# Patient Record
Sex: Female | Born: 1960 | Race: Black or African American | Hispanic: No | Smoking: Former smoker
Health system: Southern US, Community
[De-identification: ages and names within clinical notes are randomized; demographics above are authoritative.]

## PROBLEM LIST (undated history)

## (undated) DIAGNOSIS — I509 Heart failure, unspecified: Secondary | ICD-10-CM

## (undated) DIAGNOSIS — F419 Anxiety disorder, unspecified: Secondary | ICD-10-CM

## (undated) DIAGNOSIS — E119 Type 2 diabetes mellitus without complications: Secondary | ICD-10-CM

## (undated) DIAGNOSIS — M1990 Unspecified osteoarthritis, unspecified site: Secondary | ICD-10-CM

## (undated) DIAGNOSIS — I10 Essential (primary) hypertension: Secondary | ICD-10-CM

---

## 2014-11-15 ENCOUNTER — Other Ambulatory Visit (HOSPITAL_BASED_OUTPATIENT_CLINIC_OR_DEPARTMENT_OTHER): Payer: Self-pay | Admitting: Family Medicine

## 2014-11-15 DIAGNOSIS — Z1231 Encounter for screening mammogram for malignant neoplasm of breast: Secondary | ICD-10-CM

## 2014-11-23 ENCOUNTER — Ambulatory Visit (HOSPITAL_BASED_OUTPATIENT_CLINIC_OR_DEPARTMENT_OTHER): Payer: Self-pay

## 2016-05-19 ENCOUNTER — Emergency Department (HOSPITAL_BASED_OUTPATIENT_CLINIC_OR_DEPARTMENT_OTHER): Payer: Medicare Other

## 2016-05-19 ENCOUNTER — Inpatient Hospital Stay (HOSPITAL_BASED_OUTPATIENT_CLINIC_OR_DEPARTMENT_OTHER)
Admission: EM | Admit: 2016-05-19 | Discharge: 2016-05-30 | DRG: 287 | Disposition: A | Discharge status: Home / Self Care | Payer: Medicare Other | Attending: Internal Medicine | Admitting: Internal Medicine

## 2016-05-19 ENCOUNTER — Encounter (HOSPITAL_BASED_OUTPATIENT_CLINIC_OR_DEPARTMENT_OTHER): Payer: Self-pay | Admitting: Emergency Medicine

## 2016-05-19 DIAGNOSIS — F17210 Nicotine dependence, cigarettes, uncomplicated: Secondary | ICD-10-CM | POA: Diagnosis present

## 2016-05-19 DIAGNOSIS — E042 Nontoxic multinodular goiter: Secondary | ICD-10-CM | POA: Diagnosis present

## 2016-05-19 DIAGNOSIS — I509 Heart failure, unspecified: Secondary | ICD-10-CM | POA: Diagnosis not present

## 2016-05-19 DIAGNOSIS — I110 Hypertensive heart disease with heart failure: Secondary | ICD-10-CM | POA: Diagnosis not present

## 2016-05-19 DIAGNOSIS — J040 Acute laryngitis: Secondary | ICD-10-CM | POA: Diagnosis not present

## 2016-05-19 DIAGNOSIS — M25511 Pain in right shoulder: Secondary | ICD-10-CM | POA: Diagnosis present

## 2016-05-19 DIAGNOSIS — K5900 Constipation, unspecified: Secondary | ICD-10-CM | POA: Diagnosis present

## 2016-05-19 DIAGNOSIS — K449 Diaphragmatic hernia without obstruction or gangrene: Secondary | ICD-10-CM | POA: Diagnosis present

## 2016-05-19 DIAGNOSIS — K047 Periapical abscess without sinus: Secondary | ICD-10-CM | POA: Diagnosis present

## 2016-05-19 DIAGNOSIS — Z23 Encounter for immunization: Secondary | ICD-10-CM

## 2016-05-19 DIAGNOSIS — R1013 Epigastric pain: Secondary | ICD-10-CM

## 2016-05-19 DIAGNOSIS — Z6841 Body Mass Index (BMI) 40.0 and over, adult: Secondary | ICD-10-CM

## 2016-05-19 DIAGNOSIS — Z0189 Encounter for other specified special examinations: Secondary | ICD-10-CM

## 2016-05-19 DIAGNOSIS — F329 Major depressive disorder, single episode, unspecified: Secondary | ICD-10-CM | POA: Diagnosis present

## 2016-05-19 DIAGNOSIS — I420 Dilated cardiomyopathy: Secondary | ICD-10-CM

## 2016-05-19 DIAGNOSIS — I5021 Acute systolic (congestive) heart failure: Secondary | ICD-10-CM

## 2016-05-19 DIAGNOSIS — I10 Essential (primary) hypertension: Secondary | ICD-10-CM

## 2016-05-19 DIAGNOSIS — R7989 Other specified abnormal findings of blood chemistry: Secondary | ICD-10-CM

## 2016-05-19 DIAGNOSIS — R079 Chest pain, unspecified: Secondary | ICD-10-CM

## 2016-05-19 DIAGNOSIS — R748 Abnormal levels of other serum enzymes: Secondary | ICD-10-CM | POA: Diagnosis present

## 2016-05-19 DIAGNOSIS — Z716 Tobacco abuse counseling: Secondary | ICD-10-CM

## 2016-05-19 DIAGNOSIS — Z888 Allergy status to other drugs, medicaments and biological substances status: Secondary | ICD-10-CM

## 2016-05-19 DIAGNOSIS — K219 Gastro-esophageal reflux disease without esophagitis: Secondary | ICD-10-CM | POA: Diagnosis present

## 2016-05-19 DIAGNOSIS — F419 Anxiety disorder, unspecified: Secondary | ICD-10-CM | POA: Diagnosis present

## 2016-05-19 DIAGNOSIS — I5043 Acute on chronic combined systolic (congestive) and diastolic (congestive) heart failure: Secondary | ICD-10-CM | POA: Diagnosis present

## 2016-05-19 DIAGNOSIS — Z79899 Other long term (current) drug therapy: Secondary | ICD-10-CM

## 2016-05-19 DIAGNOSIS — R0602 Shortness of breath: Secondary | ICD-10-CM

## 2016-05-19 DIAGNOSIS — I472 Ventricular tachycardia: Secondary | ICD-10-CM | POA: Diagnosis not present

## 2016-05-19 DIAGNOSIS — R072 Precordial pain: Secondary | ICD-10-CM | POA: Diagnosis not present

## 2016-05-19 DIAGNOSIS — R109 Unspecified abdominal pain: Secondary | ICD-10-CM

## 2016-05-19 DIAGNOSIS — N179 Acute kidney failure, unspecified: Secondary | ICD-10-CM

## 2016-05-19 DIAGNOSIS — E119 Type 2 diabetes mellitus without complications: Secondary | ICD-10-CM

## 2016-05-19 DIAGNOSIS — Z8249 Family history of ischemic heart disease and other diseases of the circulatory system: Secondary | ICD-10-CM

## 2016-05-19 DIAGNOSIS — R778 Other specified abnormalities of plasma proteins: Secondary | ICD-10-CM

## 2016-05-19 HISTORY — DX: Essential (primary) hypertension: I10

## 2016-05-19 HISTORY — DX: Unspecified osteoarthritis, unspecified site: M19.90

## 2016-05-19 HISTORY — DX: Anxiety disorder, unspecified: F41.9

## 2016-05-19 HISTORY — DX: Type 2 diabetes mellitus without complications: E11.9

## 2016-05-19 LAB — BASIC METABOLIC PANEL
ANION GAP: 10 (ref 5–15)
BUN: 12 mg/dL (ref 6–20)
CO2: 26 mmol/L (ref 22–32)
Calcium: 9.5 mg/dL (ref 8.9–10.3)
Chloride: 102 mmol/L (ref 101–111)
Creatinine, Ser: 1.09 mg/dL — ABNORMAL HIGH (ref 0.44–1.00)
GFR calc Af Amer: >60 mL/min (ref >60)
GFR, EST NON AFRICAN AMERICAN: 56 mL/min — AB (ref >60)
GLUCOSE: 136 mg/dL — AB (ref 65–99)
POTASSIUM: 4 mmol/L (ref 3.5–5.1)
SODIUM: 138 mmol/L (ref 135–145)

## 2016-05-19 LAB — CBC WITH DIFFERENTIAL/PLATELET
BASOS ABS: 0 10*3/uL (ref 0.0–0.1)
BASOS PCT: 0 %
Eosinophils Absolute: 0.3 10*3/uL (ref 0.0–0.7)
Eosinophils Relative: 2 %
HEMATOCRIT: 41.3 % (ref 36.0–46.0)
Hemoglobin: 13.5 g/dL (ref 12.0–15.0)
Lymphocytes Relative: 17 %
Lymphs Abs: 2.1 10*3/uL (ref 0.7–4.0)
MCH: 26.7 pg (ref 26.0–34.0)
MCHC: 32.7 g/dL (ref 30.0–36.0)
MCV: 81.8 fL (ref 78.0–100.0)
Monocytes Absolute: 0.8 10*3/uL (ref 0.1–1.0)
Monocytes Relative: 7 %
NEUTROS ABS: 9 10*3/uL — AB (ref 1.7–7.7)
NEUTROS PCT: 74 %
Platelets: 299 10*3/uL (ref 150–400)
RBC: 5.05 MIL/uL (ref 3.87–5.11)
RDW: 16.5 % — ABNORMAL HIGH (ref 11.5–15.5)
WBC: 12.2 10*3/uL — ABNORMAL HIGH (ref 4.0–10.5)

## 2016-05-19 LAB — TROPONIN I: TROPONIN I: 0.03 ng/mL — AB (ref <0.03)

## 2016-05-19 LAB — BRAIN NATRIURETIC PEPTIDE: B Natriuretic Peptide: 249.4 pg/mL — ABNORMAL HIGH (ref 0.0–100.0)

## 2016-05-19 MED ORDER — ASPIRIN 81 MG PO CHEW
324.0000 mg | CHEWABLE_TABLET | Freq: Once | ORAL | Status: AC
  Administered 2016-05-19: 324 mg via ORAL
  Filled 2016-05-19: qty 4

## 2016-05-19 MED ORDER — ONDANSETRON HCL 4 MG/2ML IJ SOLN
4.0000 mg | Freq: Four times a day (QID) | INTRAMUSCULAR | Status: DC | PRN
  Filled 2016-05-19: qty 2

## 2016-05-19 MED ORDER — ACETAMINOPHEN 325 MG PO TABS: 650.0000 mg | ORAL_TABLET | ORAL | Status: DC | PRN

## 2016-05-19 MED ORDER — ENOXAPARIN SODIUM 40 MG/0.4ML ~~LOC~~ SOLN
40.0000 mg | SUBCUTANEOUS | Status: DC
  Administered 2016-05-20: 40 mg via SUBCUTANEOUS
  Filled 2016-05-19: qty 0.4

## 2016-05-19 MED ORDER — FUROSEMIDE 20 MG PO TABS: 20.0000 mg | ORAL_TABLET | Freq: Every day | ORAL | Status: DC

## 2016-05-19 MED ORDER — SODIUM CHLORIDE 0.9 % IV SOLN: 250.0000 mL | INTRAVENOUS | Status: DC | PRN

## 2016-05-19 MED ORDER — METOPROLOL TARTRATE 25 MG PO TABS
25.0000 mg | ORAL_TABLET | Freq: Two times a day (BID) | ORAL | Status: DC
  Administered 2016-05-20 – 2016-05-22 (×6): 25 mg via ORAL
  Filled 2016-05-19 (×6): qty 1

## 2016-05-19 MED ORDER — FUROSEMIDE 10 MG/ML IJ SOLN
40.0000 mg | INTRAMUSCULAR | Status: AC
  Administered 2016-05-19: 40 mg via INTRAVENOUS
  Filled 2016-05-19: qty 4

## 2016-05-19 MED ORDER — SODIUM CHLORIDE 0.9% FLUSH
3.0000 mL | Freq: Two times a day (BID) | INTRAVENOUS | Status: DC
  Administered 2016-05-20 – 2016-05-29 (×16): 3 mL via INTRAVENOUS

## 2016-05-19 MED ORDER — LISINOPRIL 5 MG PO TABS: 5.0000 mg | ORAL_TABLET | Freq: Every day | ORAL | Status: DC

## 2016-05-19 MED ORDER — VARENICLINE TARTRATE 0.5 MG PO TABS
0.5000 mg | ORAL_TABLET | Freq: Two times a day (BID) | ORAL | Status: DC
  Filled 2016-05-19 (×2): qty 1

## 2016-05-19 MED ORDER — OXYCODONE HCL 5 MG PO TABS
30.0000 mg | ORAL_TABLET | Freq: Four times a day (QID) | ORAL | Status: DC | PRN
  Administered 2016-05-20 – 2016-05-30 (×34): 30 mg via ORAL
  Filled 2016-05-19 (×35): qty 6

## 2016-05-19 MED ORDER — NIFEDIPINE ER 60 MG PO TB24
60.0000 mg | ORAL_TABLET | Freq: Every day | ORAL | Status: DC
  Administered 2016-05-20: 60 mg via ORAL
  Filled 2016-05-19: qty 1

## 2016-05-19 MED ORDER — SODIUM CHLORIDE 0.9% FLUSH: 3.0000 mL | INTRAVENOUS | Status: DC | PRN

## 2016-05-19 NOTE — H&P (Addendum)
History and Physical    Anjannette Gauger ZOX:[id number] DOB: 21-Dec-1960 DOA: 05/19/2016  PCP: Elpidio Eric, MD  Patient coming from: Home  I have personally briefly reviewed patient's old medical records in [facility]  Chief Complaint: SOB  HPI: Kristopher Delk is a 56 y.o. female with medical history significant of HTN, DM2.  Patient presents to the ED with c/o 1 week history of LE swelling R>L, orthopnea, chest pressure.  SOB worse when laying down, better when sitting up.  DOE.   ED Course: Trop 0.03, BNP 250, CXR shows mild pulmonary vascular congestion.  Edema has already improved post  IV lasix.   Review of Systems: As per HPI otherwise 10 point review of systems negative.   Past Medical History:  Diagnosis Date  . Anxiety   . Arthritis   . Diabetes mellitus without complication (HCC)   . Hypertension     History reviewed. No pertinent surgical history.   reports that she has been smoking.  She has never used smokeless tobacco. She reports that she drinks alcohol. She reports that she does not use drugs.  Allergies  Allergen Reactions  . Lisinopril Other (See Comments)    Cough    History reviewed. No pertinent family history. Neg for CHF  Prior to Admission medications   Medication Sig Start Date End Date Taking? Authorizing Provider  metoprolol tartrate (LOPRESSOR) 25 MG tablet Take 25 mg by mouth 2 (two) times daily.   Yes Historical Provider, MD  NIFEdipine (PROCARDIA XL/ADALAT-CC) 60 MG 24 hr tablet Take 60 mg by mouth daily.   Yes Historical Provider, MD  oxycodone (ROXICODONE) 30 MG immediate release tablet Take 30 mg by mouth every 6 (six) hours as needed for pain.   Yes Historical Provider, MD  varenicline (CHANTIX) 0.5 MG tablet Take 0.5 mg by mouth 2 (two) times daily.   Yes Historical Provider, MD    Physical Exam: Vitals:   05/19/16 2100 05/19/16 2130 05/19/16 2150 05/19/16 2306  BP: 123/79  (!) 155/69 (!) 152/109  Pulse: 78 86 80 68  Resp:  (!) 23 (!) 21 (!) 22 (!) 26  Temp:   98.7 F (37.1 C) 98.7 F (37.1 C)  TempSrc:   Oral Oral  SpO2: 94% 99% 98% 100%  Weight:    (!) 153.6 kg (338 lb 9.6 oz)  Height:    5' 7.5" (1.715 m)    Constitutional: NAD, calm, comfortable Eyes: PERRL, lids and conjunctivae normal ENMT: Mucous membranes are moist. Posterior pharynx clear of any exudate or lesions.Normal dentition. L tympanic membrane clear, EOC clear, appears to have TMJ on left causing ear pain. Neck: normal, supple, no masses, no thyromegaly Respiratory: Wheezes R base, crackles L base Cardiovascular: Regular rate and rhythm, no murmurs / rubs / gallops. Trace BLE edema. 2+ pedal pulses. No carotid bruits.  Abdomen: no tenderness, no masses palpated. No hepatosplenomegaly. Bowel sounds positive.  Musculoskeletal: no clubbing / cyanosis. No joint deformity upper and lower extremities. Good ROM, no contractures. Normal muscle tone.  Skin: no rashes, lesions, ulcers. No induration Neurologic: CN 2-12 grossly intact. Sensation intact, DTR normal. Strength 5/5 in all 4.  Psychiatric: Normal judgment and insight. Alert and oriented x 3. Normal mood.    Labs on Admission: I have personally reviewed following labs and imaging studies  CBC:  Recent Labs Lab 05/19/16 1855  WBC 12.2*  NEUTROABS 9.0*  HGB 13.5  HCT 41.3  MCV 81.8  PLT 299   Basic Metabolic Panel:  Recent Labs Lab 05/19/16 1855  NA 138  K 4.0  CL 102  CO2 26  GLUCOSE 136*  BUN 12  CREATININE 1.09*  CALCIUM 9.5   GFR: Estimated Creatinine Clearance: 90.2 mL/min (A) (by C-G formula based on SCr of 1.09 mg/dL (H)). Liver Function Tests: No results for input(s): AST, ALT, ALKPHOS, BILITOT, PROT, ALBUMIN in the last 168 hours. No results for input(s): LIPASE, AMYLASE in the last 168 hours. No results for input(s): AMMONIA in the last 168 hours. Coagulation Profile: No results for input(s): INR, PROTIME in the last 168 hours. Cardiac  Enzymes:  Recent Labs Lab 05/19/16 1855  TROPONINI 0.03*   BNP (last 3 results) No results for input(s): PROBNP in the last 8760 hours. HbA1C: No results for input(s): HGBA1C in the last 72 hours. CBG: No results for input(s): GLUCAP in the last 168 hours. Lipid Profile: No results for input(s): CHOL, HDL, LDLCALC, TRIG, CHOLHDL, LDLDIRECT in the last 72 hours. Thyroid Function Tests: No results for input(s): TSH, T4TOTAL, FREET4, T3FREE, THYROIDAB in the last 72 hours. Anemia Panel: No results for input(s): VITAMINB12, FOLATE, FERRITIN, TIBC, IRON, RETICCTPCT in the last 72 hours. Urine analysis: No results found for: COLORURINE, APPEARANCEUR, LABSPEC, PHURINE, GLUCOSEU, HGBUR, BILIRUBINUR, KETONESUR, PROTEINUR, UROBILINOGEN, NITRITE, LEUKOCYTESUR  Radiological Exams on Admission: Dg Chest 2 View  Result Date: 05/19/2016 CLINICAL DATA:  Chest pain. EXAM: CHEST  2 VIEW COMPARISON:  Radiographs of August 06, 2012. FINDINGS: Stable cardiomegaly. No pneumothorax or pleural effusion is noted. Mild central pulmonary vascular congestion is noted. No consolidative process is noted. Bony thorax is unremarkable. IMPRESSION: Stable cardiomegaly. Mild central pulmonary vascular congestion is noted. Electronically Signed   By: Lupita Raider, M.D.   On: 05/19/2016 19:30    EKG: Independently reviewed.  Assessment/Plan Principal Problem:   CHF exacerbation (HCC)    1. New onset CHF - DDX includes edema secondary to CCB although this wouldn't explain the SOB, orthopnea symptoms.  PE and RLE DVT seems less likely given CXR findings. 1. CHF pathway 2. Lasix  PO daily 3. Allergy to lisinopril 4. 2d echo ordered to evaluate further 5. Tele monitor 2. HTN - continue home BP meds 3. DM2 - patient takes 70/30: 50u in AM, and 40u in PM but BGLs have been running low recently she says. 1. Will reduce dose to 35u BID 2. CBG checks AC/HS  DVT prophylaxis: Lovenox Code Status: Full Family  Communication: Family at bed side Disposition Plan: Home after admit Consults called: None Admission status: Place in [place], Heywood Iles. DO [facility] Pager [phone]  If 7AM-7PM, please contact day team taking care of patient [email or web address] Password [email or web address]  05/19/2016, 11:17 PM

## 2016-05-19 NOTE — ED Provider Notes (Signed)
[facility] Provider Note   CSN: [id number] Arrival date & time: 05/19/16  1821  By signing my name below, I, Freida Busman, attest that this documentation has been prepared under the direction and in the presence of Roxy Horseman, PA-C. Electronically Signed: Freida Busman, Scribe. 05/19/2016. 6:51 PM.  History   Chief Complaint Chief Complaint  Patient presents with  . Chest Pain    The history is provided by the patient. No language interpreter was used.    HPI Comments:  Amber Melendez is a 56 y.o. female with a history of DM and HTN,  who presents to the Emergency Department complaining of central CP x a few days. She also describes a heaviness in her epigastric region. Her pain is worse at night when supine. She reports associated SOB and DOE. She has some relief when she sits upright. She denies hx of cardiac and kidney issues.  Pt also notes right foot swelling that is improving. No nausea, vomiting, or diaphoresis.   Past Medical History:  Diagnosis Date  . Anxiety   . Arthritis   . Diabetes mellitus without complication (HCC)   . Hypertension     Patient Active Problem List   Diagnosis Date Noted  . CHF exacerbation (HCC) 05/19/2016    No past surgical history on file.  OB History    No data available       Home Medications    Prior to Admission medications   Medication Sig Start Date End Date Taking? Authorizing Provider  metoprolol tartrate (LOPRESSOR) 25 MG tablet Take 25 mg by mouth 2 (two) times daily.   Yes Historical Provider, MD  NIFEdipine (PROCARDIA XL/ADALAT-CC) 60 MG 24 hr tablet Take 60 mg by mouth daily.   Yes Historical Provider, MD  oxycodone (ROXICODONE) 30 MG immediate release tablet Take 30 mg by mouth every 6 (six) hours as needed for pain.   Yes Historical Provider, MD  varenicline (CHANTIX) 0.5 MG tablet Take 0.5 mg by mouth 2 (two) times daily.   Yes Historical Provider, MD    Family History No family history on  file.  Social History Social History  Substance Use Topics  . Smoking status: Current Every Day Smoker  . Smokeless tobacco: Never Used  . Alcohol use Yes     Allergies   Patient has no known allergies.   Review of Systems Review of Systems  Constitutional: Negative for diaphoresis.  Respiratory: Positive for shortness of breath.   Cardiovascular: Positive for chest pain and leg swelling.  Gastrointestinal: Positive for abdominal pain. Negative for nausea and vomiting.  All other systems reviewed and are negative.    Physical Exam Updated Vital Signs BP (!) 155/69   Pulse 80   Temp 98.7 F (37.1 C) (Oral)   Resp (!) 22   Ht  (1.727 m)   Wt (!) 325 lb (147.4 kg)   LMP 04/22/2016   SpO2 98%   BMI 49.42 kg/m   Physical Exam  Constitutional: She is oriented to person, place, and time. She appears well-developed and well-nourished. No distress.  HENT:  Head: Normocephalic and atraumatic.  Eyes: Conjunctivae are normal.  Cardiovascular: Normal rate.   Pulmonary/Chest: Effort normal. She has wheezes in the left lower field. She has rhonchi in the right lower field.  Right lower lobe rhonchi  Left lower lobe mild expiratory wheeze   Abdominal: She exhibits no distension.  Musculoskeletal: She exhibits edema (mild 1+ BLE ).  Neurological: She is alert and  oriented to person, place, and time.  Skin: Skin is warm and dry.  Psychiatric: She has a normal mood and affect.  Nursing note and vitals reviewed.    ED Treatments / Results  DIAGNOSTIC STUDIES:  Oxygen Saturation is 96% on RA, normal by my interpretation.    COORDINATION OF CARE:  10:27 PM Discussed treatment plan with pt at bedside and pt agreed to plan.  Labs (all labs ordered are listed, but only abnormal results are displayed) Labs Reviewed  BASIC METABOLIC PANEL - Abnormal; Notable for the following:       Result Value   Glucose, Bld 136 (*)    Creatinine, Ser 1.09 (*)    GFR calc non Af  Amer 56 (*)    All other components within normal limits  TROPONIN I - Abnormal; Notable for the following:    Troponin I 0.03 (*)    All other components within normal limits  CBC WITH DIFFERENTIAL/PLATELET - Abnormal; Notable for the following:    WBC 12.2 (*)    RDW 16.5 (*)    Neutro Abs 9.0 (*)    All other components within normal limits  BRAIN NATRIURETIC PEPTIDE - Abnormal; Notable for the following:    B Natriuretic Peptide 249.4 (*)    All other components within normal limits    EKG  EKG Interpretation  Date/Time:  Monday May 19 2016 18:36:08 EDT Ventricular Rate:  78 PR Interval:    QRS Duration: 94 QT Interval:  429 QTC Calculation: 489 R Axis:   49 Text Interpretation:  Sinus rhythm Atrial premature complexes Low voltage, precordial leads Borderline prolonged QT interval No old tracing to compare Confirmed by FLOYD MD, DANIEL [phone]) on 05/19/2016 6:45:46 PM       Radiology Dg Chest 2 View  Result Date: 05/19/2016 CLINICAL DATA:  Chest pain. EXAM: CHEST  2 VIEW COMPARISON:  Radiographs of August 06, 2012. FINDINGS: Stable cardiomegaly. No pneumothorax or pleural effusion is noted. Mild central pulmonary vascular congestion is noted. No consolidative process is noted. Bony thorax is unremarkable. IMPRESSION: Stable cardiomegaly. Mild central pulmonary vascular congestion is noted. Electronically Signed   By: Lupita Raider, M.D.   On: 05/19/2016 19:30    Procedures Procedures (including critical care time)  Medications Ordered in ED Medications  aspirin chewable tablet 324 mg (324 mg Oral Given 05/19/16 1845)  furosemide (LASIX) injection 40 mg (40 mg Intravenous Given 05/19/16 2015)     Initial Impression / Assessment and Plan / ED Course  I have reviewed the triage vital signs and the nursing notes.  Pertinent labs & imaging results that were available during my care of the patient were reviewed by me and considered in my medical decision making (see  chart for details).     Patient with dyspnea on exertion, lower extremity swelling, and chest pressure. Reports associated orthopnea. No history of CHF. Symptoms are concerning for CHF, will check BNP, chest x-ray, and labs. Chest x-ray shows pulmonary vascular congestion. BNP is 249. Troponin is 0.03, likely demand. Will give patient 40 mg IV Lasix. Discussed with Dr. Adela Lank, who agrees with plan for admission.  Appreciate Dr. Kara Pacer for admitting the patient.  Final Clinical Impressions(s) / ED Diagnoses   Final diagnoses:  SOB (shortness of breath)  Elevated brain natriuretic peptide (BNP) level  Elevated troponin    New Prescriptions Current Discharge Medication List     I personally performed the services described in this documentation, which was scribed in  my presence. The recorded information has been reviewed and is accurate.       Roxy Horseman, PA-C 05/19/16 2243    Melene Plan, DO 05/19/16 2310

## 2016-05-19 NOTE — ED Notes (Signed)
ED Provider at bedside. 

## 2016-05-19 NOTE — ED Triage Notes (Signed)
Pt having centralized chest pain since Friday.  Pt having right shoulder pain for over one month.  Some sob with lying down.  No N/V or diaphoresis.  Pt mother died at 25 from MI.

## 2016-05-19 NOTE — ED Notes (Signed)
Pt assisted to bathroom. NAD and steady gait.

## 2016-05-19 NOTE — Plan of Care (Signed)
PCP Richard Harvie Junior  56 yo F Hx of HTN DM no prior hx of CHF Comes with chest pressure, leg edema BNP 250 no prior Trop 0.03  No ECG changes ER lasix 40  BP 139/83, on 94% RA  No current chest pain  Accepted to tele bed Amber Melendez 8:33 PM

## 2016-05-19 NOTE — ED Notes (Signed)
Patient transported to X-ray 

## 2016-05-20 ENCOUNTER — Observation Stay (HOSPITAL_BASED_OUTPATIENT_CLINIC_OR_DEPARTMENT_OTHER): Payer: Medicare Other

## 2016-05-20 ENCOUNTER — Encounter (HOSPITAL_COMMUNITY): Payer: Self-pay

## 2016-05-20 DIAGNOSIS — Z794 Long term (current) use of insulin: Secondary | ICD-10-CM | POA: Diagnosis not present

## 2016-05-20 DIAGNOSIS — E119 Type 2 diabetes mellitus without complications: Secondary | ICD-10-CM

## 2016-05-20 DIAGNOSIS — I10 Essential (primary) hypertension: Secondary | ICD-10-CM | POA: Diagnosis not present

## 2016-05-20 DIAGNOSIS — E1165 Type 2 diabetes mellitus with hyperglycemia: Secondary | ICD-10-CM | POA: Diagnosis not present

## 2016-05-20 DIAGNOSIS — R7989 Other specified abnormal findings of blood chemistry: Secondary | ICD-10-CM | POA: Diagnosis not present

## 2016-05-20 DIAGNOSIS — I509 Heart failure, unspecified: Secondary | ICD-10-CM | POA: Diagnosis not present

## 2016-05-20 LAB — HIV ANTIBODY (ROUTINE TESTING W REFLEX): HIV SCREEN 4TH GENERATION: NONREACTIVE

## 2016-05-20 LAB — BASIC METABOLIC PANEL
ANION GAP: 10 (ref 5–15)
BUN: 15 mg/dL (ref 6–20)
CHLORIDE: 102 mmol/L (ref 101–111)
CO2: 27 mmol/L (ref 22–32)
Calcium: 9.4 mg/dL (ref 8.9–10.3)
Creatinine, Ser: 1.09 mg/dL — ABNORMAL HIGH (ref 0.44–1.00)
GFR calc Af Amer: >60 mL/min (ref >60)
GFR calc non Af Amer: 56 mL/min — ABNORMAL LOW (ref >60)
GLUCOSE: 140 mg/dL — AB (ref 65–99)
POTASSIUM: 3.8 mmol/L (ref 3.5–5.1)
Sodium: 139 mmol/L (ref 135–145)

## 2016-05-20 LAB — ECHOCARDIOGRAM COMPLETE
HEIGHTINCHES: 67.5 in
Weight: 5416 oz

## 2016-05-20 LAB — GLUCOSE, CAPILLARY
GLUCOSE-CAPILLARY: 109 mg/dL — AB (ref 65–99)
Glucose-Capillary: 131 mg/dL — ABNORMAL HIGH (ref 65–99)
Glucose-Capillary: 107 mg/dL — ABNORMAL HIGH (ref 65–99)
Glucose-Capillary: 113 mg/dL — ABNORMAL HIGH (ref 65–99)

## 2016-05-20 LAB — TROPONIN I: Troponin I: <0.03 ng/mL (ref <0.03)

## 2016-05-20 MED ORDER — LOSARTAN POTASSIUM 25 MG PO TABS
25.0000 mg | ORAL_TABLET | Freq: Every day | ORAL | Status: DC
  Administered 2016-05-21 – 2016-05-22 (×2): 25 mg via ORAL
  Filled 2016-05-20 (×2): qty 1

## 2016-05-20 MED ORDER — GABAPENTIN 300 MG PO CAPS
600.0000 mg | ORAL_CAPSULE | Freq: Every day | ORAL | Status: DC
  Administered 2016-05-20 – 2016-05-30 (×11): 600 mg via ORAL
  Filled 2016-05-20 (×11): qty 2

## 2016-05-20 MED ORDER — DIPHENHYDRAMINE HCL 25 MG PO CAPS
25.0000 mg | ORAL_CAPSULE | ORAL | Status: DC | PRN
  Administered 2016-05-20 – 2016-05-24 (×2): 25 mg via ORAL
  Filled 2016-05-20 (×2): qty 1

## 2016-05-20 MED ORDER — FUROSEMIDE 10 MG/ML IJ SOLN
40.0000 mg | Freq: Two times a day (BID) | INTRAMUSCULAR | Status: DC
  Administered 2016-05-20 – 2016-05-21 (×3): 40 mg via INTRAVENOUS
  Filled 2016-05-20 (×3): qty 4

## 2016-05-20 MED ORDER — GABAPENTIN 300 MG PO CAPS
900.0000 mg | ORAL_CAPSULE | Freq: Every day | ORAL | Status: DC
  Administered 2016-05-20 – 2016-05-29 (×10): 900 mg via ORAL
  Filled 2016-05-20 (×10): qty 3

## 2016-05-20 MED ORDER — ENOXAPARIN SODIUM 80 MG/0.8ML ~~LOC~~ SOLN
0.5000 mg/kg | SUBCUTANEOUS | Status: DC
  Administered 2016-05-20 – 2016-05-24 (×5): 75 mg via SUBCUTANEOUS
  Filled 2016-05-20 (×5): qty 0.8

## 2016-05-20 MED ORDER — FLUOXETINE HCL 20 MG PO CAPS
40.0000 mg | ORAL_CAPSULE | Freq: Every day | ORAL | Status: DC
  Administered 2016-05-20 – 2016-05-30 (×12): 40 mg via ORAL
  Filled 2016-05-20 (×12): qty 2

## 2016-05-20 MED ORDER — PNEUMOCOCCAL VAC POLYVALENT 25 MCG/0.5ML IJ INJ
0.5000 mL | INJECTION | INTRAMUSCULAR | Status: AC
  Administered 2016-05-21: 0.5 mL via INTRAMUSCULAR
  Filled 2016-05-20: qty 0.5

## 2016-05-20 MED ORDER — PANTOPRAZOLE SODIUM 40 MG PO TBEC
40.0000 mg | DELAYED_RELEASE_TABLET | Freq: Every day | ORAL | Status: DC
  Administered 2016-05-20 – 2016-05-21 (×2): 40 mg via ORAL
  Filled 2016-05-20 (×3): qty 1

## 2016-05-20 MED ORDER — INSULIN ASPART PROT & ASPART (70-30 MIX) 100 UNIT/ML ~~LOC~~ SUSP
35.0000 [IU] | Freq: Two times a day (BID) | SUBCUTANEOUS | Status: DC
  Administered 2016-05-20 – 2016-05-30 (×5): 35 [IU] via SUBCUTANEOUS
  Filled 2016-05-20 (×3): qty 10

## 2016-05-20 NOTE — Care Management Obs Status (Signed)
MEDICARE OBSERVATION STATUS NOTIFICATION   Patient Details  Name: Amber Melendez MRN: [id number] Date of Birth: 13-Oct-1960   Medicare Observation Status Notification Given:  Yes    MahabirOlegario Messier, RN 05/20/2016, 1:07 PM

## 2016-05-20 NOTE — Progress Notes (Addendum)
[facility] PROGRESS NOTE    Progress Note  Amber Melendez  ZOX:[id number] DOB: 1960/09/08 DOA: 05/19/2016 PCP: Elpidio Eric, MD     Brief Narrative:   Amber Melendez is an 56 y.o. female past medical history of essential hypertension, diabetes mellitus comes into the ED complaining of one-week lower extremity edema right greater than left orthopnea, his BMP was greater than 250 chest x-ray showed possible pulmonary edema.  Assessment/Plan:   Undeterminate CHF exacerbation (HCC) At home she is on Lasix 20 mg, he has an allergy to Lisinopril. 2-D echo is pending. Continue daily weights. Strict I's and O's her on nitrate, continue fluid restriction. Continue IV Lasix every 12 hours monitor electrolytes replete as needed. Currently on beta blocker, will add Losartan Unknown estimated dry weight.  Essential HTN (hypertension) Blood pressure mildly high being more towards DC nifedipine. He has an allergy to lisinopril, so we'll try to use losartan.  DM2 (diabetes mellitus, type 2) (HCC): Continue current regimen. Fair control.  Right shoulder pain: She cannot elevate her arms above her head likely a rotator cuff injury will continue NSAIDs.  Elevated troponins: In the setting of chronic renal disease and acute decompensated heart failure. -Repeated another set of cardiac enzymes. If flat or improve is probably due to acute decompensated heart failure in the setting of chronic renal disease.   DVT prophylaxis: lovenox Family Communication:none Disposition Plan/Barrier to D/C: home in 3-4 days Code Status:     Code Status Orders        Start     Ordered   05/19/16 2253  Full code  Continuous     05/19/16 2253    Code Status History    Date Active Date Inactive Code Status Order ID Comments User Context   This patient has a current code status but no historical code status.        IV Access:    Peripheral IV   Procedures and diagnostic studies:   Dg Chest 2  View  Result Date: 05/19/2016 CLINICAL DATA:  Chest pain. EXAM: CHEST  2 VIEW COMPARISON:  Radiographs of August 06, 2012. FINDINGS: Stable cardiomegaly. No pneumothorax or pleural effusion is noted. Mild central pulmonary vascular congestion is noted. No consolidative process is noted. Bony thorax is unremarkable. IMPRESSION: Stable cardiomegaly. Mild central pulmonary vascular congestion is noted. Electronically Signed   By: Lupita Raider, M.D.   On: 05/19/2016 19:30     Medical Consultants:    None.  Anti-Infectives:   None  Subjective:    Amber Melendez she relates her breathing is better, she will also relates she can now a flat to sleep.  Objective:    Vitals:   05/19/16 2130 05/19/16 2150 05/19/16 2306 05/20/16 0522  BP:  (!) 155/69 (!) 152/109 (!) 156/64  Pulse: 86 80 68 70  Resp: (!) 21 (!) 22 (!) 26 (!) 22  Temp:  98.7 F (37.1 C) 98.7 F (37.1 C) 98.6 F (37 C)  TempSrc:  Oral Oral Oral  SpO2: 99% 98% 100% 100%  Weight:   (!) 153.6 kg (338 lb 9.6 oz) (!) 153.5 kg (338 lb 8 oz)  Height:   5' 7.5" (1.715 m)     Intake/Output Summary (Last 24 hours) at 05/20/16 0837 Last data filed at 05/19/16 2358  Gross per 24 hour  Intake              100 ml  Output  0 ml  Net              100 ml   Filed Weights   05/19/16 1832 05/19/16 2306 05/20/16 0522  Weight: (!) 147.4 kg (325 lb) (!) 153.6 kg (338 lb 9.6 oz) (!) 153.5 kg (338 lb 8 oz)    Exam: General exam:In no acute distress she is tearful Respiratory system: Good air movement clear to auscultation Cardiovascular system: Regular rate and rhythm with positive S1-S2 Gastrointestinal system: Positive bowel sounds, soft nontender Central nervous system: Nonfocal Extremities: Trace lower extremity edema Skin: No rashes or ulceration.   Data Reviewed:    Labs: Basic Metabolic Panel:  Recent Labs Lab 05/19/16 1855 05/20/16 0541  NA 138 139  K 4.0 3.8  CL 102 102  CO2 26 27  GLUCOSE 136*  140*  BUN 12 15  CREATININE 1.09* 1.09*  CALCIUM 9.5 9.4   GFR Estimated Creatinine Clearance: 90.2 mL/min (A) (by C-G formula based on SCr of 1.09 mg/dL (H)). Liver Function Tests: No results for input(s): AST, ALT, ALKPHOS, BILITOT, PROT, ALBUMIN in the last 168 hours. No results for input(s): LIPASE, AMYLASE in the last 168 hours. No results for input(s): AMMONIA in the last 168 hours. Coagulation profile No results for input(s): INR, PROTIME in the last 168 hours.  CBC:  Recent Labs Lab 05/19/16 1855  WBC 12.2*  NEUTROABS 9.0*  HGB 13.5  HCT 41.3  MCV 81.8  PLT 299   Cardiac Enzymes:  Recent Labs Lab 05/19/16 1855  TROPONINI 0.03*   BNP (last 3 results) No results for input(s): PROBNP in the last 8760 hours. CBG:  Recent Labs Lab 05/20/16 0749  GLUCAP 131*   D-Dimer: No results for input(s): DDIMER in the last 72 hours. Hgb A1c: No results for input(s): HGBA1C in the last 72 hours. Lipid Profile: No results for input(s): CHOL, HDL, LDLCALC, TRIG, CHOLHDL, LDLDIRECT in the last 72 hours. Thyroid function studies: No results for input(s): TSH, T4TOTAL, T3FREE, THYROIDAB in the last 72 hours.  Invalid input(s): FREET3 Anemia work up: No results for input(s): VITAMINB12, FOLATE, FERRITIN, TIBC, IRON, RETICCTPCT in the last 72 hours. Sepsis Labs:  Recent Labs Lab 05/19/16 1855  WBC 12.2*   Microbiology No results found for this or any previous visit (from the past 240 hour(s)).   Medications:   . enoxaparin (LOVENOX) injection  0.5 mg/kg Subcutaneous Q24H  . FLUoxetine  40 mg Oral Daily  . furosemide  20 mg Oral Daily  . gabapentin  600 mg Oral Daily  . gabapentin  900 mg Oral QHS  . insulin aspart protamine- aspart  35 Units Subcutaneous BID WC  . metoprolol tartrate  25 mg Oral BID  . NIFEdipine  60 mg Oral Daily  . pantoprazole  40 mg Oral Daily  . [START ON 05/21/2016] pneumococcal 23 valent vaccine  0.5 mL Intramuscular Tomorrow-1000  .  sodium chloride flush  3 mL Intravenous Q12H   Continuous Infusions: . sodium chloride      Time spent: 25 min   LOS: 0 days   Marinda Elk  [facility] Pager [phone]  *Please refer to [email or web address], password [email or web address] to get updated schedule on who will round on this patient, as hospitalists switch teams weekly. If 7PM-7AM, please contact night-coverage at [email or web address], password [email or web address] for any overnight needs.  05/20/2016, 8:37 AM

## 2016-05-20 NOTE — Progress Notes (Signed)
  Echocardiogram 2D Echocardiogram has been performed.  Amber Melendez 05/20/2016, 2:55 PM

## 2016-05-20 NOTE — Care Management Note (Signed)
Case Management Note  Patient Details  Name: Wanell Lorenzi MRN: [id number] Date of Birth: 07-Feb-1960  Subjective/Objective:  56 y/o f admitted w/CHF. From home.PT/OT cons-await recc.                  Action/Plan:d/c plan home.   Expected Discharge Date:                  Expected Discharge Plan:  Home/Self Care  In-House Referral:     Discharge planning Services  CM Consult  Post Acute Care Choice:    Choice offered to:     DME Arranged:    DME Agency:     HH Arranged:    HH Agency:     Status of Service:  In process, will continue to follow  If discussed at Long Length of Stay Meetings, dates discussed:    Additional Comments:  Lanier Clam, RN 05/20/2016, 12:24 PM

## 2016-05-20 NOTE — Progress Notes (Signed)
Chaplain visited patient for Advanced Directive education per chat consult.  Patient states she is under the influence of medication right now and asked the Chaplain if she could come back another time.  Chaplain will plan to visit with the patient later today or on tomorrow for Advanced Directive education and possible completion.      05/20/16 1016  Clinical Encounter Type  Visited With Patient  Visit Type Initial;Social support  Referral From Physician  Stress Factors  Patient Stress Factors Health changes

## 2016-05-20 NOTE — Evaluation (Signed)
Occupational Therapy Evaluation Patient Details Name: Amber Melendez MRN: [id number] DOB: [birth year] Today's Date: 05/20/2016    History of Present Illness Pt is a 56 y/o F with PMHx including HTN, DM2.  Patient presents to the ED with c/o 1 week history of LE swelling R>L, orthopnea, chest pressure, SOB worse when laying down, and DOE. Pt also reports low back pain, R shoulder pain   Clinical Impression   This 56 y/o F presents with the above. Pt presents below baseline with ADLs and functional mobility, greatest deficits in LB dressing and LB bathing. Pt reports recently experiencing increased pain (lower back and R shoulder) and dyspnea during ADL completion. Pt will benefit from continued acute OT services to increase endurance, safety, and independence with ADLs and functional mobility. Goals are for MinA to supervision.    Follow Up Recommendations  No OT follow up;Supervision - Intermittent    Equipment Recommendations  3 in 1 bedside commode           Precautions / Restrictions Precautions Precautions: Fall Restrictions Weight Bearing Restrictions: No      Mobility Bed Mobility               General bed mobility comments: OOB   Transfers Overall transfer level: Needs assistance Equipment used: Rolling walker (2 wheeled) Transfers: Sit to/from Stand Sit to Stand: Min guard         General transfer comment: verbal cues for hand placement     Balance Overall balance assessment: No apparent balance deficits (not formally assessed)                                         ADL either performed or assessed with clinical judgement   ADL Overall ADL's : Needs assistance/impaired Eating/Feeding: Independent;Sitting   Grooming: Wash/dry hands;Min guard;Standing   Upper Body Bathing: Min guard;Sitting   Lower Body Bathing: Minimal assistance;Sit to/from stand   Upper Body Dressing : Min guard;Sitting   Lower Body Dressing: Moderate  assistance;Sit to/from stand   Toilet Transfer: Min guard;Cueing for safety;Ambulation;BSC;RW Toilet Transfer Details (indicate cue type and reason): BSC over toilet  Toileting- Clothing Manipulation and Hygiene: Min guard;Sit to/from Nurse, children's Details (indicate cue type and reason): educated on use of 3:1 as shower chair to increase safety during bathing  Functional mobility during ADLs: Min guard;Rolling walker General ADL Comments: Pt ambulating in hallway with sister at start of session, requiring one seated rest break prior to returning to room. Educated on energy conservation and compensatory techniques for completing ADLs and functional mobility.                          Pertinent Vitals/Pain Pain Assessment: Faces Faces Pain Scale: Hurts a little bit Pain Location: back, R shoulder during movement  Pain Descriptors / Indicators: Sore;Grimacing Pain Intervention(s): Limited activity within patient's tolerance;Monitored during session     Hand Dominance Right   Extremity/Trunk Assessment Upper Extremity Assessment Upper Extremity Assessment: RUE deficits/detail RUE Deficits / Details: Limited RUE AROM due to pain; shoulder abduction and external rotation is [facility] but painful during movement RUE: Unable to fully assess due to pain           Communication Communication Communication: No difficulties   Cognition Arousal/Alertness: Awake/alert Behavior During Therapy: WFL for tasks assessed/performed Overall Cognitive Status: Within Functional  Limits for tasks assessed                                     General Comments                  Home Living Family/patient expects to be discharged to:: Private residence Living Arrangements: Other relatives Available Help at Discharge: Family               Bathroom Shower/Tub: Chief Strategy Officer: Standard     Home Equipment: Gilmer Mor - single point           Prior Functioning/Environment Level of Independence: Independent                 OT Problem List: Decreased strength;Decreased activity tolerance;Decreased knowledge of use of DME or AE;Cardiopulmonary status limiting activity;Pain      OT Treatment/Interventions: Self-care/ADL training;Energy conservation;DME and/or AE instruction;Therapeutic activities    OT Goals(Current goals can be found in the care plan section) Acute Rehab OT Goals Patient Stated Goal: continue to be independent with ADLs and functional mobility  OT Goal Formulation: With patient Time For Goal Achievement: 05/27/16 Potential to Achieve Goals: Good ADL Goals Pt Will Perform Grooming: with supervision;standing Pt Will Perform Lower Body Dressing: with min assist;with adaptive equipment;sit to/from stand Pt Will Transfer to Toilet: with supervision;ambulating;bedside commode (BSC over toilet ) Additional ADL Goal #1: Pt will independently demonstrate at least one energy conservation technique during ADL task completion.   OT Frequency: Min 2X/week                             AM-PAC PT "6 Clicks" Daily Activity     Outcome Measure Help from another person eating meals?: None Help from another person taking care of personal grooming?: A Little Help from another person toileting, which includes using toliet, bedpan, or urinal?: A Little Help from another person bathing (including washing, rinsing, drying)?: A Little Help from another person to put on and taking off regular upper body clothing?: A Little Help from another person to put on and taking off regular lower body clothing?: A Lot 6 Click Score: 18   End of Session Equipment Utilized During Treatment: Rolling walker Nurse Communication: Mobility status  Activity Tolerance: Patient tolerated treatment well Patient left: in chair;with call bell/phone within reach;with family/visitor present  OT Visit Diagnosis: Muscle weakness  (generalized) (M62.81)                Time: 6213-0865 OT Time Calculation (min): 19 min Charges:  OT General Charges $OT Visit: 1 Procedure OT Evaluation $OT Eval Low Complexity: 1 Procedure G-Codes: OT G-codes **NOT FOR INPATIENT CLASS** Functional Assessment Tool Used: AM-PAC 6 Clicks Daily Activity;Clinical judgement Functional Limitation: Self care Self Care Current Status (H8469): At least 40 percent but less than 60 percent impaired, limited or restricted Self Care Goal Status (G2952): At least 20 percent but less than 40 percent impaired, limited or restricted   Marcy Siren, OT Pager [phone] 05/20/2016   Orlando Penner 05/20/2016, 3:52 PM

## 2016-05-21 DIAGNOSIS — I5043 Acute on chronic combined systolic (congestive) and diastolic (congestive) heart failure: Secondary | ICD-10-CM | POA: Diagnosis present

## 2016-05-21 DIAGNOSIS — I509 Heart failure, unspecified: Secondary | ICD-10-CM

## 2016-05-21 DIAGNOSIS — I420 Dilated cardiomyopathy: Secondary | ICD-10-CM | POA: Diagnosis present

## 2016-05-21 DIAGNOSIS — K047 Periapical abscess without sinus: Secondary | ICD-10-CM | POA: Diagnosis present

## 2016-05-21 DIAGNOSIS — J040 Acute laryngitis: Secondary | ICD-10-CM | POA: Diagnosis not present

## 2016-05-21 DIAGNOSIS — Z6841 Body Mass Index (BMI) 40.0 and over, adult: Secondary | ICD-10-CM | POA: Diagnosis not present

## 2016-05-21 DIAGNOSIS — I10 Essential (primary) hypertension: Secondary | ICD-10-CM | POA: Diagnosis not present

## 2016-05-21 DIAGNOSIS — R609 Edema, unspecified: Secondary | ICD-10-CM | POA: Diagnosis not present

## 2016-05-21 DIAGNOSIS — M25511 Pain in right shoulder: Secondary | ICD-10-CM | POA: Diagnosis present

## 2016-05-21 DIAGNOSIS — Z794 Long term (current) use of insulin: Secondary | ICD-10-CM | POA: Diagnosis not present

## 2016-05-21 DIAGNOSIS — R748 Abnormal levels of other serum enzymes: Secondary | ICD-10-CM | POA: Diagnosis present

## 2016-05-21 DIAGNOSIS — K449 Diaphragmatic hernia without obstruction or gangrene: Secondary | ICD-10-CM | POA: Diagnosis present

## 2016-05-21 DIAGNOSIS — K219 Gastro-esophageal reflux disease without esophagitis: Secondary | ICD-10-CM | POA: Diagnosis present

## 2016-05-21 DIAGNOSIS — N179 Acute kidney failure, unspecified: Secondary | ICD-10-CM

## 2016-05-21 DIAGNOSIS — I472 Ventricular tachycardia: Secondary | ICD-10-CM | POA: Diagnosis not present

## 2016-05-21 DIAGNOSIS — F329 Major depressive disorder, single episode, unspecified: Secondary | ICD-10-CM | POA: Diagnosis present

## 2016-05-21 DIAGNOSIS — Z8249 Family history of ischemic heart disease and other diseases of the circulatory system: Secondary | ICD-10-CM | POA: Diagnosis not present

## 2016-05-21 DIAGNOSIS — R7989 Other specified abnormal findings of blood chemistry: Secondary | ICD-10-CM | POA: Diagnosis not present

## 2016-05-21 DIAGNOSIS — I5041 Acute combined systolic (congestive) and diastolic (congestive) heart failure: Secondary | ICD-10-CM | POA: Diagnosis not present

## 2016-05-21 DIAGNOSIS — Z888 Allergy status to other drugs, medicaments and biological substances status: Secondary | ICD-10-CM | POA: Diagnosis not present

## 2016-05-21 DIAGNOSIS — K5900 Constipation, unspecified: Secondary | ICD-10-CM | POA: Diagnosis present

## 2016-05-21 DIAGNOSIS — R0602 Shortness of breath: Secondary | ICD-10-CM | POA: Diagnosis not present

## 2016-05-21 DIAGNOSIS — R072 Precordial pain: Secondary | ICD-10-CM | POA: Diagnosis not present

## 2016-05-21 DIAGNOSIS — E042 Nontoxic multinodular goiter: Secondary | ICD-10-CM | POA: Diagnosis present

## 2016-05-21 DIAGNOSIS — E119 Type 2 diabetes mellitus without complications: Secondary | ICD-10-CM | POA: Diagnosis present

## 2016-05-21 DIAGNOSIS — F17210 Nicotine dependence, cigarettes, uncomplicated: Secondary | ICD-10-CM | POA: Diagnosis present

## 2016-05-21 DIAGNOSIS — I5021 Acute systolic (congestive) heart failure: Secondary | ICD-10-CM | POA: Diagnosis not present

## 2016-05-21 DIAGNOSIS — I200 Unstable angina: Secondary | ICD-10-CM | POA: Diagnosis not present

## 2016-05-21 DIAGNOSIS — E1165 Type 2 diabetes mellitus with hyperglycemia: Secondary | ICD-10-CM | POA: Diagnosis not present

## 2016-05-21 DIAGNOSIS — F419 Anxiety disorder, unspecified: Secondary | ICD-10-CM | POA: Diagnosis present

## 2016-05-21 DIAGNOSIS — I110 Hypertensive heart disease with heart failure: Secondary | ICD-10-CM | POA: Diagnosis present

## 2016-05-21 DIAGNOSIS — R079 Chest pain, unspecified: Secondary | ICD-10-CM | POA: Diagnosis not present

## 2016-05-21 DIAGNOSIS — Z79899 Other long term (current) drug therapy: Secondary | ICD-10-CM | POA: Diagnosis not present

## 2016-05-21 DIAGNOSIS — Z23 Encounter for immunization: Secondary | ICD-10-CM | POA: Diagnosis present

## 2016-05-21 DIAGNOSIS — Z720 Tobacco use: Secondary | ICD-10-CM

## 2016-05-21 LAB — BASIC METABOLIC PANEL
Anion gap: 11 (ref 5–15)
BUN: 18 mg/dL (ref 6–20)
CALCIUM: 9.3 mg/dL (ref 8.9–10.3)
CHLORIDE: 100 mmol/L — AB (ref 101–111)
CO2: 27 mmol/L (ref 22–32)
CREATININE: 1.22 mg/dL — AB (ref 0.44–1.00)
GFR, EST AFRICAN AMERICAN: 56 mL/min — AB (ref >60)
GFR, EST NON AFRICAN AMERICAN: 49 mL/min — AB (ref >60)
Glucose, Bld: 145 mg/dL — ABNORMAL HIGH (ref 65–99)
Potassium: 3.8 mmol/L (ref 3.5–5.1)
SODIUM: 138 mmol/L (ref 135–145)

## 2016-05-21 LAB — GLUCOSE, CAPILLARY
GLUCOSE-CAPILLARY: 144 mg/dL — AB (ref 65–99)
GLUCOSE-CAPILLARY: 123 mg/dL — AB (ref 65–99)
Glucose-Capillary: 133 mg/dL — ABNORMAL HIGH (ref 65–99)
Glucose-Capillary: 120 mg/dL — ABNORMAL HIGH (ref 65–99)

## 2016-05-21 MED ORDER — FUROSEMIDE 10 MG/ML IJ SOLN
40.0000 mg | Freq: Once | INTRAMUSCULAR | Status: AC
  Administered 2016-05-21: 40 mg via INTRAVENOUS
  Filled 2016-05-21: qty 4

## 2016-05-21 MED ORDER — SPIRONOLACTONE 25 MG PO TABS
12.5000 mg | ORAL_TABLET | Freq: Every day | ORAL | Status: DC
  Administered 2016-05-21 – 2016-05-26 (×6): 12.5 mg via ORAL
  Filled 2016-05-21 (×6): qty 1

## 2016-05-21 MED ORDER — ALBUTEROL SULFATE (2.5 MG/3ML) 0.083% IN NEBU
2.5000 mg | INHALATION_SOLUTION | RESPIRATORY_TRACT | Status: DC | PRN
  Administered 2016-05-24 – 2016-05-25 (×2): 2.5 mg via RESPIRATORY_TRACT
  Filled 2016-05-21 (×3): qty 3

## 2016-05-21 MED ORDER — NICOTINE POLACRILEX 2 MG MT GUM
2.0000 mg | CHEWING_GUM | OROMUCOSAL | Status: DC | PRN
  Administered 2016-05-21 – 2016-05-24 (×2): 2 mg via ORAL
  Filled 2016-05-21 (×4): qty 1

## 2016-05-21 MED ORDER — IPRATROPIUM BROMIDE 0.02 % IN SOLN
0.5000 mg | Freq: Once | RESPIRATORY_TRACT | Status: AC
  Administered 2016-05-21: 0.5 mg via RESPIRATORY_TRACT
  Filled 2016-05-21: qty 2.5

## 2016-05-21 MED ORDER — ALBUTEROL SULFATE (2.5 MG/3ML) 0.083% IN NEBU
5.0000 mg | INHALATION_SOLUTION | Freq: Once | RESPIRATORY_TRACT | Status: AC
  Administered 2016-05-21: 5 mg via RESPIRATORY_TRACT
  Filled 2016-05-21: qty 6

## 2016-05-21 MED ORDER — FUROSEMIDE 10 MG/ML IJ SOLN
80.0000 mg | Freq: Two times a day (BID) | INTRAMUSCULAR | Status: DC
  Administered 2016-05-21: 80 mg via INTRAVENOUS
  Filled 2016-05-21: qty 8

## 2016-05-21 NOTE — Progress Notes (Signed)
PROGRESS NOTE [facility]   Fayelynn Distel   ZOX:[id number] DOB: January 15, 1961  DOA: 05/19/2016 PCP: Elpidio Eric, MD   Brief Narrative:  Amber Melendez is a 56 y.o. female with past medical history of her change in hypertension, diabetes mellitus, presented to the ED complaining of one week of lower extremity edema, orthopnea and increasing shortness of breath. She was found to have signs of pulmonary edema and was admitted for CHF exacerbation which is a new diagnosis and she was started on aggressive diuresis.  Subjective: Patient seen and examined, she is feeling improved since yesterday. Denies shortness of breath and chest pain. She report poor urine output.   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 No motion wall abnormalities. BMP 249. Chest x-ray showed increasing vascular congestion Per patient for diuresis, will increase IV Lasix to 80 twice a day Will add Aldactone, patient currently on beta blocker and losartan, will titrate beta blocker as tolerated Strict I&O's, fluid restriction, low-salt diet, monitor daily weights. Cardiology consulted  Mild elevated troponin In setting of acute decompensated heart failure Troponins flat and EKG with no changes  Essential hypertension - stable Patient was on nifedipine which was discontinued on 05/20/2016 Patient was started on losartan given reported allergy to lisinopril Monitor BP  Diabetes mellitus type 2 - CABG stable Continue current regimen  Tobacco abuse Cessation discussed Nicotine gum order  AKI  ? Cardiorenal syndrome, patient with poor urine output Lasix increase, expect improvement with diuresis  Check BMP in AM   DVT prophylaxis: Lovenox Code Status: Full Family Communication: None at bedside Disposition Plan: Home when medically stable  Consultants:    Cardiology   Procedures:   ECHO 05/20/16 ------------------------------------------------------------------- Study Conclusions  - Left ventricle: The cavity size was normal. Wall thickness was   normal. Systolic function was moderately to severely reduced. The   estimated ejection fraction was in the range of 30% to 35%.   Diffuse hypokinesis. Features are consistent with a pseudonormal   left ventricular filling pattern, with concomitant abnormal   relaxation and increased filling pressure (grade 2 diastolic   dysfunction). - Left atrium: The atrium was moderately dilated. - Right atrium: The atrium was mildly dilated.  Impressions:  - Moderate to severe global reduction in LV systolic function;   moderate diastolic dysfunction; biatrial enlargement.   Antimicrobials: Antibiotics Given (last 72 hours)    None        Objective: Vitals:   05/20/16 0522 05/20/16 2025 05/21/16 0524 05/21/16 0525  BP: (!) 156/64 (!) 144/76  (!) 124/48  Pulse: 70 68  62  Resp: (!) Temp: 98.6 F (37 C) 98 F (36.7 C)  98.1 F (36.7 C)  TempSrc: Oral Oral  Oral  SpO2: 100% 97%  96%  Weight: (!) 153.5 kg (338 lb 8 oz)  (!) 155 kg (341 lb 12.8 oz)   Height:        Intake/Output Summary (Last 24 hours) at 05/21/16 1405 Last data filed at 05/21/16 1149  Gross per 24 hour  Intake              200 ml  Output              600 ml  Net             -400 ml   Filed Weights   05/19/16 2306  05/20/16 0522 05/21/16 0524  Weight: (!) 153.6 kg (338 lb 9.6 oz) (!) 153.5 kg (338 lb 8 oz) (!) 155 kg (341 lb 12.8 oz)    Examination:  General exam: Appears calm and comfortable  HEENT: PERRLA, OP moist and clear Respiratory system: Good air entry, distant sounds due to body complex  Cardiovascular system: S1 & S2 heard, RRR. No JVD, murmurs, rubs or gallops Gastrointestinal system: Abdomen is nondistended, soft and nontender. No organomegaly or masses felt. Normal bowel sounds  heard. Central nervous system: Alert and oriented. Extremities: trace LE edema. Symmetric  Skin: No rashes, lesions or ulcers Psychiatry: Judgement and insight appear normal. Mood & affect appropriate.    Data Reviewed: I have personally reviewed following labs and imaging studies  CBC:  Recent Labs Lab 05/19/16 1855  WBC 12.2*  NEUTROABS 9.0*  HGB 13.5  HCT 41.3  MCV 81.8  PLT 299   Basic Metabolic Panel:  Recent Labs Lab 05/19/16 1855 05/20/16 0541 05/21/16 0528  NA 138 139 138  K 4.0 3.8 3.8  CL 102 102 100*  CO2 GLUCOSE 136* 140* 145*  BUN CREATININE 1.09* 1.09* 1.22*  CALCIUM 9.5 9.4 9.3   Cardiac Enzymes:  Recent Labs Lab 05/19/16 1855 05/20/16 1022  TROPONINI 0.03* <0.03   CBG:  Recent Labs Lab 05/20/16 1117 05/20/16 1633 05/20/16 2028 05/21/16 0755 05/21/16 1132  GLUCAP 109* 107* 113* 144* 123*    Radiology Studies: Dg Chest 2 View  Result Date: 05/19/2016 CLINICAL DATA:  Chest pain. EXAM: CHEST  2 VIEW COMPARISON:  Radiographs of August 06, 2012. FINDINGS: Stable cardiomegaly. No pneumothorax or pleural effusion is noted. Mild central pulmonary vascular congestion is noted. No consolidative process is noted. Bony thorax is unremarkable. IMPRESSION: Stable cardiomegaly. Mild central pulmonary vascular congestion is noted. Electronically Signed   By: Lupita Raider, M.D.   On: 05/19/2016 19:30    Scheduled Meds: . enoxaparin (LOVENOX) injection  0.5 mg/kg Subcutaneous Q24H  . FLUoxetine  40 mg Oral Daily  . furosemide  80 mg Intravenous Q12H  . gabapentin  600 mg Oral Daily  . gabapentin  900 mg Oral QHS  . insulin aspart protamine- aspart  35 Units Subcutaneous BID WC  . losartan  25 mg Oral Daily  . metoprolol tartrate  25 mg Oral BID  . pantoprazole  40 mg Oral Daily  . sodium chloride flush  3 mL Intravenous Q12H  . spironolactone  12.5 mg Oral Daily   Continuous Infusions: . sodium chloride       LOS: 0  days    Amber Dodrill, MD Pager: Text Page via [email or web address]  [phone]  If 7PM-7AM, please contact night-coverage [email or web address] Password [email or web address] 05/21/2016, 2:05 PM

## 2016-05-21 NOTE — Care Management Note (Signed)
Case Management Note  Patient Details  Name: Amber Melendez MRN: [id number] Date of Birth: 1960/05/17  Subjective/Objective:  PT recc-4wheeled rw,OT-recc 3n1-[facility] rep Selena Batten aware of order, will deliver to rm prior d/c.  MD aware of manual script for otpt PT.                Action/Plan:d/c plan home.   Expected Discharge Date:                  Expected Discharge Plan:  OP Rehab  In-House Referral:     Discharge planning Services  CM Consult  Post Acute Care Choice:    Choice offered to:     DME Arranged:  3-N-1, Walker rolling with seat DME Agency:  [facility]  HH Arranged:    HH Agency:     Status of Service:  In process, will continue to follow  If discussed at Long Length of Stay Meetings, dates discussed:    Additional Comments:  Lanier Clam, RN 05/21/2016, 1:55 PM

## 2016-05-21 NOTE — Care Management Note (Signed)
Case Management Note  Patient Details  Name: [name] MRN: [id number] Date of Birth: [birth year]  Subjective/Objective:  PT-recc otpt PT. Will provide otpt PT resource list. Will need manual script for otpt PT, & dx from attending. No further CM needs.                  Action/Plan:d/c plan home.   Expected Discharge Date:                  Expected Discharge Plan:  OP Rehab  In-House Referral:     Discharge planning Services  CM Consult  Post Acute Care Choice:    Choice offered to:     DME Arranged:    DME Agency:     HH Arranged:    HH Agency:     Status of Service:  In process, will continue to follow  If discussed at Long Length of Stay Meetings, dates discussed:    Additional Comments:  Lanier Clam, RN 05/21/2016, 1:12 PM

## 2016-05-21 NOTE — Evaluation (Signed)
Physical Therapy One Time Evaluation Patient Details Name: Amber Melendez MRN: [id number] DOB: 21-Feb-1960 Today's Date: 05/21/2016   History of Present Illness  Pt is a 56 y/o F with PMHx including HTN, DM2.  Patient presents to the ED with c/o 1 week history of LE swelling R>L, orthopnea, chest pressure, SOB worse when laying down, and DOE. Pt also reports low back pain, R shoulder pain  Clinical Impression  Patient evaluated by Physical Therapy with no further acute PT needs identified. All education has been completed and the patient has no further questions. Pt with slow gait and fatigues quickly however reports not being very active at baseline.  Pt reports chronic low back pain due to arthritis and would benefit from outpatient PT.  Pt provided with brochure and referral form for [facility] outpatient services per her request however she is aware she can choose any outpatient clinic.  Pt would benefit from rollator as she has decreased endurance and requires frequent rest breaks at this time, however if this is not available, then RW. PT is signing off. Thank you for this referral.     Follow Up Recommendations Outpatient PT    Equipment Recommendations  Rolling walker with 5" wheels (would best benefit from rollator if possible, if not then RW)    Recommendations for Other Services       Precautions / Restrictions Precautions Precautions: None Restrictions Weight Bearing Restrictions: No      Mobility  Bed Mobility Overal bed mobility: Modified Independent                Transfers Overall transfer level: Needs assistance Equipment used: Rolling walker (2 wheeled) Transfers: Sit to/from Stand Sit to Stand: Supervision         General transfer comment: verbal cues for safe use of RW  Ambulation/Gait Ambulation/Gait assistance: Supervision Ambulation Distance (Feet): 180 Feet Assistive device: Rolling walker (2 wheeled) Gait Pattern/deviations: Step-through  pattern;Trunk flexed     General Gait Details: verbal cues for safe use of RW, SpO2 89-90% on room air during ambulation, 2 standing rest breaks required for fatigue and back pain  Stairs            Wheelchair Mobility    Modified Rankin (Stroke Patients Only)       Balance                                             Pertinent Vitals/Pain Pain Assessment: 0-10 Pain Score: 5  Pain Location: low back Pain Descriptors / Indicators: Aching;Sore Pain Intervention(s): Limited activity within patient's tolerance;Monitored during session;Repositioned;Premedicated before session    [place] expects to be discharged to:: Private residence Living Arrangements: Other relatives Available Help at Discharge: Family Type of Home: Apartment Home Access: Stairs to enter Entrance Stairs-Rails: Right Entrance Stairs-Number of Steps: Elim: Cane - single point      Prior Function Level of Independence: Independent         Comments: typically performs steps to apt with cane and rail     Hand Dominance        Extremity/Trunk Assessment        Lower Extremity Assessment Lower Extremity Assessment: Overall WFL for tasks assessed       Communication   Communication: No difficulties  Cognition Arousal/Alertness: Awake/alert Behavior During Therapy: WFL for tasks assessed/performed  Overall Cognitive Status: Within Functional Limits for tasks assessed                                        General Comments      Exercises     Assessment/Plan    PT Assessment All further PT needs can be met in the next venue of care  PT Problem List Pain;Decreased activity tolerance;Obesity       PT Treatment Interventions      PT Goals (Current goals can be found in the Care Plan section)  Acute Rehab PT Goals PT Goal Formulation: All assessment and education complete, DC therapy    Frequency      Barriers to discharge        Co-evaluation               AM-PAC PT "6 Clicks" Daily Activity  Outcome Measure Difficulty turning over in bed (including adjusting bedclothes, sheets and blankets)?: None Difficulty moving from lying on back to sitting on the side of the bed? : None Difficulty sitting down on and standing up from a chair with arms (e.g., wheelchair, bedside commode, etc,.)?: None Help needed moving to and from a bed to chair (including a wheelchair)?: None Help needed walking in hospital room?: A Little Help needed climbing 3-5 steps with a railing? : A Little 6 Click Score: 22    End of Session   Activity Tolerance: Patient tolerated treatment well Patient left: in chair;with call bell/phone within reach;with family/visitor present Nurse Communication: Mobility status PT Visit Diagnosis: Pain Pain - Right/Left:  (mid) Pain - part of body:  (low back)    Time: 1173-5670 PT Time Calculation (min) (ACUTE ONLY): 12 min   Charges:   PT Evaluation $PT Eval Low Complexity: 1 Procedure     PT G Codes:   PT G-Codes **NOT FOR INPATIENT CLASS** Functional Assessment Tool Used: AM-PAC 6 Clicks Basic Mobility;Clinical judgement Functional Limitation: Mobility: Walking and moving around Mobility: Walking and Moving Around Current Status (L4103): At least 1 percent but less than 20 percent impaired, limited or restricted Mobility: Walking and Moving Around Goal Status [phone]): At least 1 percent but less than 20 percent impaired, limited or restricted Mobility: Walking and Moving Around Discharge Status [phone]): At least 1 percent but less than 20 percent impaired, limited or restricted   Carmelia Bake, PT, DPT 05/21/2016 Pager: [phone]   York Ram E 05/21/2016, 11:36 AM

## 2016-05-21 NOTE — Progress Notes (Signed)
Occupational Therapy Treatment Patient Details Name: Amber Melendez MRN: [id number] DOB: October 14, 1960 Today's Date: 05/21/2016    History of present illness Pt is a 56 y/o F with PMHx including HTN, DM2.  Patient presents to the ED with c/o 1 week history of LE swelling R>L, orthopnea, chest pressure, SOB worse when laying down, and DOE. Pt also reports low back pain, R shoulder pain   OT comments  Pt progressing towards goals. Session included education on energy conservation, compensatory techniques, and AE for completing ADL/IADL tasks with handout provided. Additional UE HEP goal added to increase strength and endurance. Pt will benefit from continued OT services to increase safety and independence with ADLs and functional mobility. Continue per POC.    Follow Up Recommendations  No OT follow up;Supervision - Intermittent    Equipment Recommendations  3 in 1 bedside commode          Precautions / Restrictions Precautions Precautions: None Restrictions Weight Bearing Restrictions: No       Mobility Bed Mobility               General bed mobility comments: OOB in chair   Transfers Overall transfer level: Needs assistance Equipment used: Rolling walker (2 wheeled) Transfers: Sit to/from Stand Sit to Stand: Supervision         General transfer comment: verbal cues for safe use of RW    Balance Overall balance assessment: No apparent balance deficits (not formally assessed)                                         ADL either performed or assessed with clinical judgement   ADL Overall ADL's : Needs assistance/impaired Eating/Feeding: Independent;Sitting   Grooming: Wash/dry hands;Supervision/safety;Standing               Lower Body Dressing: Minimal assistance;Sit to/from stand;With adaptive equipment Lower Body Dressing Details (indicate cue type and reason): educated on AE for completing LB dressing              Functional mobility  during ADLs: Supervision/safety;Rolling walker General ADL Comments: Pt completed room level functional mobility to complete standing grooming tasks, O2 sats remaining above 90% on RA, provided education on energy conservation techniques for completing ADLs, educated on compensatory techniques and AE for completing tasks                       Cognition Arousal/Alertness: Awake/alert Behavior During Therapy: WFL for tasks assessed/performed Overall Cognitive Status: Within Functional Limits for tasks assessed                                                      General Comments      Pertinent Vitals/ Pain       Pain Assessment: Faces Faces Pain Scale: Hurts a little bit Pain Location: low back Pain Descriptors / Indicators: Aching;Sore Pain Intervention(s): Limited activity within patient's tolerance;Monitored during session;Repositioned  Frequency  Min 2X/week        Progress Toward Goals  OT Goals(current goals can now be found in the care plan section)  Progress towards OT goals: Progressing toward goals  Acute Rehab OT Goals Patient Stated Goal: continue to be independent with ADLs and functional mobility  OT Goal Formulation: With patient Time For Goal Achievement: 05/27/16 Potential to Achieve Goals: Good ADL Goals Pt/caregiver will Perform Home Exercise Program: Increased strength;Both right and left upper extremity;With theraband;Independently  Plan Discharge plan remains appropriate                     AM-PAC PT "6 Clicks" Daily Activity     Outcome Measure   Help from another person eating meals?: None Help from another person taking care of personal grooming?: A Little Help from another person toileting, which includes using toliet, bedpan, or urinal?: A Little Help from another person bathing (including washing, rinsing, drying)?: A  Little Help from another person to put on and taking off regular upper body clothing?: A Little Help from another person to put on and taking off regular lower body clothing?: A Lot 6 Click Score: 18    End of Session Equipment Utilized During Treatment: Rolling walker  OT Visit Diagnosis: Muscle weakness (generalized) (M62.81)   Activity Tolerance Patient tolerated treatment well   Patient Left in chair;with call bell/phone within reach;with family/visitor present             Time: 1405-1430 OT Time Calculation (min): 25 min  Charges: OT General Charges $OT Visit: 1 Procedure OT Treatments $Self Care/Home Management : 8-22 mins  Marcy Siren, OT Pager [phone] 05/21/2016    Orlando Penner 05/21/2016, 3:45 PM

## 2016-05-22 ENCOUNTER — Inpatient Hospital Stay (HOSPITAL_COMMUNITY): Payer: Medicare Other

## 2016-05-22 ENCOUNTER — Encounter (HOSPITAL_COMMUNITY): Payer: Self-pay | Admitting: Physician Assistant

## 2016-05-22 DIAGNOSIS — I420 Dilated cardiomyopathy: Secondary | ICD-10-CM

## 2016-05-22 DIAGNOSIS — R079 Chest pain, unspecified: Secondary | ICD-10-CM

## 2016-05-22 DIAGNOSIS — I5021 Acute systolic (congestive) heart failure: Secondary | ICD-10-CM

## 2016-05-22 DIAGNOSIS — R0602 Shortness of breath: Secondary | ICD-10-CM

## 2016-05-22 LAB — GLUCOSE, CAPILLARY
GLUCOSE-CAPILLARY: 140 mg/dL — AB (ref 65–99)
GLUCOSE-CAPILLARY: 148 mg/dL — AB (ref 65–99)
Glucose-Capillary: 114 mg/dL — ABNORMAL HIGH (ref 65–99)
Glucose-Capillary: 116 mg/dL — ABNORMAL HIGH (ref 65–99)

## 2016-05-22 LAB — BASIC METABOLIC PANEL
ANION GAP: 12 (ref 5–15)
BUN: 20 mg/dL (ref 6–20)
CALCIUM: 9 mg/dL (ref 8.9–10.3)
CO2: 27 mmol/L (ref 22–32)
Chloride: 99 mmol/L — ABNORMAL LOW (ref 101–111)
Creatinine, Ser: 1.33 mg/dL — ABNORMAL HIGH (ref 0.44–1.00)
GFR, EST AFRICAN AMERICAN: 51 mL/min — AB (ref >60)
GFR, EST NON AFRICAN AMERICAN: 44 mL/min — AB (ref >60)
Glucose, Bld: 130 mg/dL — ABNORMAL HIGH (ref 65–99)
POTASSIUM: 3.5 mmol/L (ref 3.5–5.1)
Sodium: 138 mmol/L (ref 135–145)

## 2016-05-22 LAB — CBC WITH DIFFERENTIAL/PLATELET
BASOS ABS: 0.1 10*3/uL (ref 0.0–0.1)
BASOS PCT: 1 %
Eosinophils Absolute: 0.4 10*3/uL (ref 0.0–0.7)
Eosinophils Relative: 3 %
HEMATOCRIT: 41 % (ref 36.0–46.0)
HEMOGLOBIN: 13.5 g/dL (ref 12.0–15.0)
LYMPHS PCT: 29 %
Lymphs Abs: 3 10*3/uL (ref 0.7–4.0)
MCH: 27.2 pg (ref 26.0–34.0)
MCHC: 32.9 g/dL (ref 30.0–36.0)
MCV: 82.5 fL (ref 78.0–100.0)
Monocytes Absolute: 0.8 10*3/uL (ref 0.1–1.0)
Monocytes Relative: 7 %
NEUTROS ABS: 6.2 10*3/uL (ref 1.7–7.7)
NEUTROS PCT: 60 %
Platelets: 285 10*3/uL (ref 150–400)
RBC: 4.97 MIL/uL (ref 3.87–5.11)
RDW: 16.2 % — ABNORMAL HIGH (ref 11.5–15.5)
WBC: 10.4 10*3/uL (ref 4.0–10.5)

## 2016-05-22 LAB — CREATININE, URINE, RANDOM: CREATININE, URINE: 230.81 mg/dL

## 2016-05-22 LAB — SODIUM, URINE, RANDOM: Sodium, Ur: 16 mmol/L

## 2016-05-22 MED ORDER — LOSARTAN POTASSIUM 50 MG PO TABS
50.0000 mg | ORAL_TABLET | Freq: Every day | ORAL | Status: DC
  Administered 2016-05-23 – 2016-05-26 (×4): 50 mg via ORAL
  Filled 2016-05-22 (×4): qty 1

## 2016-05-22 MED ORDER — POTASSIUM CHLORIDE CRYS ER 20 MEQ PO TBCR
40.0000 meq | EXTENDED_RELEASE_TABLET | Freq: Once | ORAL | Status: AC
  Administered 2016-05-22: 40 meq via ORAL
  Filled 2016-05-22: qty 2

## 2016-05-22 MED ORDER — CARVEDILOL 3.125 MG PO TABS
3.1250 mg | ORAL_TABLET | Freq: Two times a day (BID) | ORAL | Status: DC
  Administered 2016-05-22 – 2016-05-26 (×8): 3.125 mg via ORAL
  Filled 2016-05-22 (×8): qty 1

## 2016-05-22 MED ORDER — FUROSEMIDE 10 MG/ML IJ SOLN: 40.0000 mg | Freq: Two times a day (BID) | INTRAMUSCULAR | Status: DC

## 2016-05-22 MED ORDER — FAMOTIDINE 20 MG PO TABS
20.0000 mg | ORAL_TABLET | Freq: Two times a day (BID) | ORAL | Status: DC
  Administered 2016-05-22 – 2016-05-23 (×3): 20 mg via ORAL
  Filled 2016-05-22 (×3): qty 1

## 2016-05-22 MED ORDER — FUROSEMIDE 40 MG PO TABS
40.0000 mg | ORAL_TABLET | Freq: Two times a day (BID) | ORAL | Status: DC
  Administered 2016-05-22 – 2016-05-26 (×8): 40 mg via ORAL
  Filled 2016-05-22 (×8): qty 1

## 2016-05-22 NOTE — Care Management Note (Signed)
Case Management Note  Patient Details  Name: Corky Singris Sano MRN: [id number] Date of Birth: 10/01/1960  Subjective/Objective: [facility] dme rep Selena BattenKim will have the DME-rw,3n1 to be shipped to patient's home per patient request.otpt PT-manual script per attending.                   Action/Plan:d/c home.   Expected Discharge Date:                  Expected Discharge Plan:  OP Rehab  In-House Referral:     Discharge planning Services  CM Consult  Post Acute Care Choice:    Choice offered to:     DME Arranged:  3-N-1, Walker rolling with seat DME Agency:  [facility]  HH Arranged:    HH Agency:     Status of Service:  In process, will continue to follow  If discussed at Long Length of Stay Meetings, dates discussed:    Additional Comments:  Lanier ClamMahabir, Jaydence Arnesen, RN 05/22/2016, 10:39 AM

## 2016-05-22 NOTE — Consult Note (Addendum)
Cardiology Consult    Patient ID: Amber Melendez MRN: [id number], DOB/AGE: 1960/09/04   Admit date: 05/19/2016 Date of Consult: 05/22/2016  Primary Physician: Elpidio Eric, MD Primary Cardiologist: new - Dr. Mayford Knife Requesting Provider: Dr. Randel Pigg  Reason for Consult: new onset CHF  Patient Profile    Amber Melendez is a 56 yo female with a PMH significant for HTN, DM, arthritis, and anxiety. She presented to [facility] with worsening lower extremity edema, SOB, and orthopnea x 1 week.  Amber Melendez is a 56 y.o. female who is being seen today for the evaluation of CHF at the request of Dr. Randel Pigg.   Past Medical History   Past Medical History:  Diagnosis Date  . Anxiety   . Arthritis   . Diabetes mellitus without complication (HCC)   . Hypertension     History reviewed. No pertinent surgical history.   Allergies  Allergies  Allergen Reactions  . Lisinopril Other (See Comments)    Cough    History of Present Illness    [name] presented to [facility] with 2-week history of lower extremity edema, orthopnea, and SOB. CXR with pulmonary edema. On my interview, she reports these symptoms gradually worsening over the past 2 weeks. She has a history of hospitalization for DKA and now takes insulin to control her DM. She also sees PCP in [place] who has been making frequent medication changes to better control her BP. She also describes a cramping sensation in her epigastric region that is sometimes relieved with gentle massage. She states she has been under stress for the past year stemming from family matters.  Her younger sister died of a MI in her 47s. She is a current smoker, denies illicit drugs.   Inpatient Medications    . enoxaparin (LOVENOX) injection  0.5 mg/kg Subcutaneous Q24H  . FLUoxetine  40 mg Oral Daily  . gabapentin  600 mg Oral Daily  . gabapentin  900 mg Oral QHS  . insulin aspart protamine- aspart  35 Units Subcutaneous BID WC  . losartan  25 mg Oral Daily  .  metoprolol tartrate  25 mg Oral BID  . pantoprazole  40 mg Oral Daily  . sodium chloride flush  3 mL Intravenous Q12H  . spironolactone  12.5 mg Oral Daily     Outpatient Medications    Prior to Admission medications   Medication Sig Start Date End Date Taking? Authorizing Provider  FLUoxetine (PROZAC) 40 MG capsule Take 40 mg by mouth daily.   Yes Historical Provider, MD  gabapentin (NEURONTIN) 300 MG capsule Take 600-900 mg by mouth 2 (two) times daily. 600 MG in Am and 900 MG in evening   Yes Historical Provider, MD  insulin lispro protamine-lispro (HUMALOG 75/25 MIX) (75-25) 100 UNIT/ML SUSP injection Inject 40-50 Units into the skin 2 (two) times daily with a meal. 50 units in the AM 40 units in the evening   Yes Historical Provider, MD  metoprolol tartrate (LOPRESSOR) 25 MG tablet Take 25 mg by mouth 2 (two) times daily.   Yes Historical Provider, MD  NIFEdipine (PROCARDIA XL/ADALAT-CC) 60 MG 24 hr tablet Take 60 mg by mouth daily.   Yes Historical Provider, MD  omeprazole (PRILOSEC) 20 MG capsule Take 20 mg by mouth daily.   Yes Historical Provider, MD  oxycodone (ROXICODONE) 30 MG immediate release tablet Take 30 mg by mouth every 6 (six) hours as needed for pain.   Yes Historical Provider, MD  varenicline (CHANTIX) 0.5 MG  tablet Take 0.5 mg by mouth 2 (two) times daily.   Yes Historical Provider, MD  vitamin E 100 UNIT capsule Take 100 Units by mouth daily.   Yes Historical Provider, MD     Family History    Family History  Problem Relation Age of Onset  . Heart attack Sister     Social History    Social History   Social History  . Marital status: Widowed    Spouse name: N/A  . Number of children: N/A  . Years of education: N/A   Occupational History  . Not on file.   Social History Main Topics  . Smoking status: Current Every Day Smoker    Packs/day: 0.50  . Smokeless tobacco: Never Used  . Alcohol use Yes  . Drug use: No  . Sexual activity: Not on file    Other Topics Concern  . Not on file   Social History Narrative  . No narrative on file     Review of Systems    General:  No chills, fever, night sweats or weight changes.  Cardiovascular:  No chest pain, + dyspnea on exertion, + edema, + orthopnea, palpitations, paroxysmal nocturnal dyspnea. Dermatological: No rash, lesions/masses Respiratory: + cough, + dyspnea Urologic: No hematuria, dysuria Abdominal:   No nausea, vomiting, diarrhea, bright red blood per rectum, melena, or hematemesis. Tender in the epigastric region Neurologic:  No visual changes, changes in mental status. All other systems reviewed and are otherwise negative except as noted above.  Physical Exam    Blood pressure (!) 143/79, pulse 63, temperature 98.8 F (37.1 C), temperature source Oral, resp. rate 18, height 5' 7.5" (1.715 m), weight (!) 339 lb 14.4 oz (154.2 kg), SpO2 93 %.  General: Pleasant, NAD, morbidly obese Psych: Normal affect. Neuro: Alert and oriented X 3. Moves all extremities spontaneously. HEENT: Normal  Neck: Supple without bruits, minimal JVD. Lungs:  Resp regular and unlabored, CTA in upper lobes, scattered expiratory wheezes Heart: RRR no murmurs, exam difficult. Abdomen: Soft, non-tender, non-distended, BS + x 4.  Extremities: No clubbing, cyanosis, no edema. DP/PT/Radials 2+ and equal bilaterally.  Labs    Troponin (Point of Care Test) No results for input(s): TROPIPOC in the last 72 hours.  Recent Labs  05/19/16 1855 05/20/16 1022  TROPONINI 0.03* <0.03   Lab Results  Component Value Date   WBC 10.4 05/22/2016   HGB 13.5 05/22/2016   HCT 41.0 05/22/2016   MCV 82.5 05/22/2016   PLT 285 05/22/2016     Recent Labs Lab 05/22/16 0553  NA 138  K 3.5  CL 99*  CO2 27  BUN 20  CREATININE 1.33*  CALCIUM 9.0  GLUCOSE 130*   No results found for: CHOL, HDL, LDLCALC, TRIG No results found for: [facility]   Radiology Studies    Dg Chest 2 View  Result Date:  05/19/2016 CLINICAL DATA:  Chest pain. EXAM: CHEST  2 VIEW COMPARISON:  Radiographs of August 06, 2012. FINDINGS: Stable cardiomegaly. No pneumothorax or pleural effusion is noted. Mild central pulmonary vascular congestion is noted. No consolidative process is noted. Bony thorax is unremarkable. IMPRESSION: Stable cardiomegaly. Mild central pulmonary vascular congestion is noted. Electronically Signed   By: Lupita Raider, M.D.   On: 05/19/2016 19:30    ECG & Cardiac Imaging    EKG 05/19/16: NSR with frequent premature complexes  Echocardiogram 05/20/16: Study Conclusions - Left ventricle: The cavity size was normal. Wall thickness was   normal. Systolic function  was moderately to severely reduced. The   estimated ejection fraction was in the range of 30% to 35%.   Diffuse hypokinesis. Features are consistent with a pseudonormal   left ventricular filling pattern, with concomitant abnormal   relaxation and increased filling pressure (grade 2 diastolic   dysfunction). - Left atrium: The atrium was moderately dilated. - Right atrium: The atrium was mildly dilated.  Impressions: - Moderate to severe global reduction in LV systolic function;   moderate diastolic dysfunction; biatrial enlargement.  Assessment & Plan    1. 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  There is no LE edema and no appreciable JVD although difficult to assess due to obesity.   - Continue strict I&Os and daily weights.  - I had a long conversation about lifestyle modifications and the importance of medication  compliance. - change Lasix to 40mg  BID PO  2.  Uncontrolled hypertension 159/105 on day of admission - BP has improved - may be due to noncompliance with meds and sodium - continue BB but will change to Carvedilol give her underlying LV dysfunction - continue ARB and aldactone.  3.  Chest pain - She reports significant epigastric pain that feels like a cramping pain or pressure. She also described pain in her lower chest that felt like someone was sitting on her chest. She does not describe typical anginal symptoms, and it is unclear if these symptoms are related to underlying poorly controlled HTN and CHF or underlying CAD. However, in an obese patient with diabetes, anginal symptoms may vary. Given her risk factors of obesity, smoking, HTN, DM, and family history of heart disease, will likely need further ischemic evaluation. Her body habitus precludes CT angiogram. Will discuss with attending NST (2 day study) vs heart cath.  4. DM - per primary team - A1c pendin  5. AKI - sCr 1.33 (1.22) - sCr was 1.09 on admission - plan for gentle diuresis and monitor kidney function - if sCr continues to trend up, will hold losartan   Signed, Marcelino Dusterngela Nicole Duke, PA-C 05/22/2016, 9:29 AM [id number]   Patient seen and independently examined with Micah FlesherAngela Duke, PA. We discussed all aspects of the encounter. I agree with the assessment and plan as stated above with modifications to reflect my thoughts.  Patient has a history of HTN, DM and anxiety.  She has no cardiac history but has a family history of premature CAD with her sister dying in her 2540's of an MI.  She also smokes 1/2 ppd of cigarettes.  She presented to the ER with complaints of LE edema, orthopnea and DOE and CXRAY showed pulmonary edema.  She also complained of chest discomfort that she describes as a cramp in her epigastric region and atypical in that it improves with massage of the area.  Exam remarkable for lungs with scattered  expiratory wheezes, heart RRR with no M/R/G and no LE edema. 2D echo showed moderately reduced LVF with EF 30-35% with diffuse HK and moderate BAE.  Trop is neg x 2 and BNP elevated at 249.  She  tells me that she had a severe viral respiratory infection a month ago and is still recovering from it so this may be a viral CM.   She was started on IV diuretics and has put out 2.1L since yesterday and is net neg 1.4L.  Her weight is down 2lbs from yesterday but up 1lb from admit so ? Accuracy.  She does not appear overly volume overloaded at present and has had good UOP so will change to Lasix 40mg  BID PO.  Given LV dysfunction I will change lopressor to Coreg 3.125mg  BID.  Continue losartan and increase to 50mg  daily.  Continue aldactone.  Her CP is very atypical.  Although she has CRF her trop is negative and EKG without acute changes. Will make NPO after MN for 2 day nuclear stress test in am.   Signed: Armanda Magic, MD [facility] 05/22/2016

## 2016-05-22 NOTE — Progress Notes (Signed)
Occupational Therapy Treatment Patient Details Name: Amber Melendez MRN: [id number] DOB: 04/04/1960 Today's Date: 05/22/2016    History of present illness Pt is a 56 y/o F with PMHx including HTN, DM2.  Patient presents to the ED with c/o 1 week history of LE swelling R>L, orthopnea, chest pressure, SOB worse when laying down, and DOE. Pt also reports low back pain, R shoulder pain   OT comments  Pt limited by R shoulder pain:  Less movement than during evaluation.  Will try active pendulums next visit, and possibly wall walking if tolerated without compensation.    Follow Up Recommendations  Supervision - Intermittent (may need see MD about shoulder if pain does not resolve)    Equipment Recommendations  3 in 1 bedside commode (?wide)    Recommendations for Other Services      Precautions / Restrictions Precautions Precautions: None Precaution Comments: R shoulder painful Restrictions Weight Bearing Restrictions: No       Mobility Bed Mobility                  Transfers                      Balance                                           ADL either performed or assessed with clinical judgement   ADL                                               Vision       Perception     Praxis      Cognition Arousal/Alertness: Awake/alert Behavior During Therapy: WFL for tasks assessed/performed Overall Cognitive Status: Within Functional Limits for tasks assessed                                          Exercises     Shoulder Instructions       General Comments Stopped by to assess and issue a theraband HEP.  Pt's R shoulder was hurting a lot and she was very limited with movement today.  She is able to fully internally rotate RUE in gravity eliminated plane but she cannot get to neutral in this position. She was only able to tolerate approximately 25 degrees of FF and abduction from supine.  She  will work on shoulder retraction as she can do this without pain. Worked on positioning UE with pillows, which she tolerated.  Pt may be able to perform active pendulums to keep joint mobile, but she did not want to try these at this time.  She has back pain, so demonstrated these with minimal bending.  Also educated on leaning to side to wash under arm and reviewed dressing sequence to minimize pain.    Pertinent Vitals/ Pain       Pain Assessment: Faces Faces Pain Scale: Hurts even more Pain Location: R shoulder Pain Descriptors / Indicators: Aching Pain Intervention(s): Limited activity within patient's tolerance;Monitored during session;Premedicated before session;Repositioned  Home Living  Prior Functioning/Environment              Frequency  Min 2X/week        Progress Toward Goals  OT Goals(current goals can now be found in the care plan section)  Progress towards OT goals: Progressing toward goals  Acute Rehab OT Goals Patient Stated Goal: continue to be independent with ADLs and functional mobility  OT Goal Formulation: With patient Time For Goal Achievement: 05/27/16  Plan      Co-evaluation                 AM-PAC PT "6 Clicks" Daily Activity     Outcome Measure   Help from another person eating meals?: None Help from another person taking care of personal grooming?: A Little Help from another person toileting, which includes using toliet, bedpan, or urinal?: A Little Help from another person bathing (including washing, rinsing, drying)?: A Little Help from another person to put on and taking off regular upper body clothing?: A Little Help from another person to put on and taking off regular lower body clothing?: A Lot 6 Click Score: 18    End of Session    OT Visit Diagnosis: Muscle weakness (generalized) (M62.81);Pain Pain - Right/Left: Right Pain - part of body: Shoulder   Activity  Tolerance Patient limited by pain   Patient Left in bed;with call bell/phone within reach;with family/visitor present   Nurse Communication          Time: 1610-9604 OT Time Calculation (min): 11 min  Charges: OT General Charges $OT Visit: 1 Procedure OT Treatments $Therapeutic Activity: 8-22 mins  Amber Melendez, OTR/L [phone] 05/22/2016   Amber Melendez 05/22/2016, 3:14 PM

## 2016-05-22 NOTE — Progress Notes (Signed)
PROGRESS NOTE [facility]   Amber Melendez   ZOX:[id number] DOB: [birth year]  DOA: 05/19/2016 PCP: Elpidio Eric, MD   Brief Narrative:  Amber Melendez is a 56 yo obese female with PMH of HTN and DM2 who presented to the ED on 4/30 with CC of LE edema, worse on RLE, orthopnea, chest pressure, DOE, and progressive SOB that is worse with laying down and better with sitting up. Patient was found to have signs of pulmonary edema and admitted for new diagnosis of CHF exacerbation and started on aggressive diuresis.   Subjective: Patient was seen and examined at bedside. Says she is improving but continues to experience slight dyspnea. Reports being thirsty, but understands why she is on fluid restriction.   Assessment & Plan:   New diagnosis of Acute combined systolic and diastolic CHF exacerbation -Echo 5/1 showed LVEF 30-35% and grade 2 diastolic dysfxn. BNP on 4/30 249.  -CXR 4/30 showed cardiomegaly and mild central pulmonary vascular congestion. Repeat CXR 5/3 showed cardiomegaly, vascular congestion, and possible early interstitial edema. - One episode of mild elevated Trp on admission d/t acute decompensated HF. Troponins flat and EKG w/no changes. -Continue IV Lasix, Aldactone, and losartan. Lopressor changed to Coreg per cards. Monitor BP. -Strict I&Os (-1.4 L), fluid restriction, low-salt diet, monitor daily weights (153.5 --> 154.2 kg) -Cardiology recommendations appreciated   Essential Hypertension -Fairly stable, fluctuates between 120-150, continue to monitor -No longer on metoprolol, pt tolerated titration well. -Started on carvedilol today, continue losartan.  Diabetes mellitus, type 2 -Continue SSI and CABG. A1c pending.  Tobacco abuse -Cessation discussed, pt snuck out to smoke cigarettes yesterday per nursing -Continue nicotine gum  AKI -Likely d/t Cardiorenal syndrome, poor urine output -Continue lasix.- morning dose was held. GFR decreased and Scr increased since  yesterday. -Recheck BMP tomorrow.  Depression -Continue fluoxetine   DVT prophylaxis: Lovenox Code Status: Full Family Communication: None at bedside Disposition Plan: Home when medically stable   Consultants:   Cardiology  Procedures:   Echo 5/1: Mod-severe global reduction in LV systolic function (EF 30-35%). Mod diastolic dysfunction. Bi-atrial enlargement.   Antimicrobials:  None   Objective: Vitals:   05/21/16 2053 05/21/16 2235 05/22/16 0554 05/22/16 1342  BP: (!) 132/55  (!) 143/79 139/67  Pulse: 63  63 64  Resp: 18  18 19   Temp: 98.8 F (37.1 C)  98.8 F (37.1 C) 98.8 F (37.1 C)  TempSrc: Oral  Oral Oral  SpO2: 96% 95% 93% 96%  Weight:   (!) 154.2 kg (339 lb 14.4 oz)   Height:        Intake/Output Summary (Last 24 hours) at 05/22/16 1343 Last data filed at 05/22/16 1200  Gross per 24 hour  Intake              580 ml  Output             1500 ml  Net             -920 ml   Filed Weights   05/20/16 0522 05/21/16 0524 05/22/16 0554  Weight: (!) 153.5 kg (338 lb 8 oz) (!) 155 kg (341 lb 12.8 oz) (!) 154.2 kg (339 lb 14.4 oz)    Examination:  General exam: Appears calm , NAD HEENT: Atraumatic and normocephalic Respiratory system: Breath sounds present, sound distant d/t large body habitus Cardiovascular system: S1 & S2 heard, RRR. No murmurs Gastrointestinal system: Abdomen is nondistended, soft and non-tender. Central nervous system: Alert and oriented. Extremities: Trace  LE edema. Skin: No lesions Psychiatry: Judgement and insight appear normal. Mood & affect appropriate.    Data Reviewed: I have personally reviewed following labs and imaging studies  CBC:  Recent Labs Lab 05/19/16 1855 05/22/16 0553  WBC 12.2* 10.4  NEUTROABS 9.0* 6.2  HGB 13.5 13.5  HCT 41.3 41.0  MCV 81.8 82.5  PLT 299 285   Basic Metabolic Panel:  Recent Labs Lab 05/19/16 1855 05/20/16 0541 05/21/16 0528 05/22/16 0553  NA 138 139 138 138  K 4.0 3.8 3.8  3.5  CL 102 102 100* 99*  CO2 26 27 27 27   GLUCOSE 136* 140* 145* 130*  BUN 12 15 18 20   CREATININE 1.09* 1.09* 1.22* 1.33*  CALCIUM 9.5 9.4 9.3 9.0   GFR: Estimated Creatinine Clearance: 74.1 mL/min (A) (by C-G formula based on SCr of 1.33 mg/dL (H)). Liver Function Tests: No results for input(s): AST, ALT, ALKPHOS, BILITOT, PROT, ALBUMIN in the last 168 hours. No results for input(s): LIPASE, AMYLASE in the last 168 hours. No results for input(s): AMMONIA in the last 168 hours. Coagulation Profile: No results for input(s): INR, PROTIME in the last 168 hours. Cardiac Enzymes:  Recent Labs Lab 05/19/16 1855 05/20/16 1022  TROPONINI 0.03* <0.03   BNP (last 3 results) No results for input(s): PROBNP in the last 8760 hours. HbA1C: No results for input(s): HGBA1C in the last 72 hours. CBG:  Recent Labs Lab 05/21/16 1132 05/21/16 1641 05/21/16 2049 05/22/16 0745 05/22/16 1159  GLUCAP 123* 133* 120* 140* 148*   Lipid Profile: No results for input(s): CHOL, HDL, LDLCALC, TRIG, CHOLHDL, LDLDIRECT in the last 72 hours. Thyroid Function Tests: No results for input(s): TSH, T4TOTAL, FREET4, T3FREE, THYROIDAB in the last 72 hours. Anemia Panel: No results for input(s): VITAMINB12, FOLATE, FERRITIN, TIBC, IRON, RETICCTPCT in the last 72 hours. Sepsis Labs: No results for input(s): PROCALCITON, LATICACIDVEN in the last 168 hours.  No results found for this or any previous visit (from the past 240 hour(s)).    Radiology Studies: Dg Chest Port 1 View  Result Date: 05/22/2016 CLINICAL DATA:  Acute exacerbation of CHF EXAM: PORTABLE CHEST 1 VIEW COMPARISON:  05/19/2016 FINDINGS: Cardiomegaly with vascular congestion. Mild interstitial prominence may reflect interstitial edema. No effusions. IMPRESSION: Cardiomegaly with vascular congestion. Possible early interstitial edema. Electronically Signed   By: Charlett Nose M.D.   On: 05/22/2016 11:32    Scheduled Meds: . carvedilol   3.125 mg Oral BID WC  . enoxaparin (LOVENOX) injection  0.5 mg/kg Subcutaneous Q24H  . famotidine  20 mg Oral BID  . FLUoxetine  40 mg Oral Daily  . gabapentin  600 mg Oral Daily  . gabapentin  900 mg Oral QHS  . insulin aspart protamine- aspart  35 Units Subcutaneous BID WC  . [START ON 05/23/2016] losartan  50 mg Oral Daily  . sodium chloride flush  3 mL Intravenous Q12H  . spironolactone  12.5 mg Oral Daily   Continuous Infusions: . sodium chloride       LOS: 1 day    Floreen Comber, PA-S   Attending MD note  Patient was seen, examined,treatment plan was discussed with the PA-S Floreen Comber.  I have personally reviewed the clinical findings, lab, imaging studies and management of this patient in detail. I agree with the documentation, as recorded by the PA-S and changes to above note were in bold green  Patient is 7 mL-year-old female with past medical history of hypertension diabetes presented to the  emergency department with lower extremity edema, orthopnea and generalized breath. She was admitted with findings of pulmonary edema and was found to have new diagnosis of CHF with low ejection fraction. Cardiology was consulted. Patient on IV diuresis and BP control. Cardiology planning for nuclear stress test.  On Exam: Gen. exam: Awake, alert, not in any distress Chest: Good air entry in the upper fields, distant sounds at the bases due to body habitus, unable to see JVD due to obesity. CVS: S1-S2 regular, no murmurs, trace pedal edema Abdomen: Soft, nontender and nondistended Neurology: Non-focal Skin: No rash or lesions  Plan Acute combining systolic and diastolic congestive heart failure Continue IV Lasix, dose decreased to 40 twice a day due to increasing creatinine Cardiology recommendations appreciated, patient will change from metoprolol to Coreg. Continue losartan and Aldactone  Strict I&O's, fluid restriction, monitor daily weights  Acute kidney  injury - likely to be cardiorenal syndrome as chest x-ray shows signs of pulmonary edema. Lasix dose was held in the morning but will continue in the afternoon. Monitor creatinine in the morning We'll send urine lites and will obtain renal ultrasound.  Essential hypertension - currently stable Patient on beta blocker, ARB's, Aldactone and Lasix Monitor BP  Tobacco abuse Cessation discussed  Rest as above  If 7PM-7AM, please contact night-coverage [email or web address] Password [email or web address] 05/22/2016, 1:43 PM

## 2016-05-22 NOTE — Progress Notes (Signed)
otpt PT manual script in patient's shadow chart for d/c-Nurse aware.

## 2016-05-23 ENCOUNTER — Inpatient Hospital Stay (HOSPITAL_COMMUNITY): Payer: Medicare Other

## 2016-05-23 ENCOUNTER — Ambulatory Visit (HOSPITAL_COMMUNITY)
Admit: 2016-05-23 | Discharge: 2016-05-23 | Disposition: A | Discharge status: Home / Self Care | Payer: Medicare Other | Attending: Physician Assistant | Admitting: Physician Assistant

## 2016-05-23 ENCOUNTER — Encounter (HOSPITAL_COMMUNITY): Payer: Self-pay | Admitting: Radiology

## 2016-05-23 ENCOUNTER — Ambulatory Visit (HOSPITAL_COMMUNITY): Admit: 2016-05-23 | Payer: Medicare Other

## 2016-05-23 DIAGNOSIS — I509 Heart failure, unspecified: Secondary | ICD-10-CM

## 2016-05-23 LAB — GLUCOSE, CAPILLARY
GLUCOSE-CAPILLARY: 130 mg/dL — AB (ref 65–99)
GLUCOSE-CAPILLARY: 112 mg/dL — AB (ref 65–99)
GLUCOSE-CAPILLARY: 116 mg/dL — AB (ref 65–99)
GLUCOSE-CAPILLARY: 130 mg/dL — AB (ref 65–99)

## 2016-05-23 LAB — BASIC METABOLIC PANEL
Anion gap: 10 (ref 5–15)
BUN: 17 mg/dL (ref 6–20)
CHLORIDE: 101 mmol/L (ref 101–111)
CO2: 26 mmol/L (ref 22–32)
Calcium: 9 mg/dL (ref 8.9–10.3)
Creatinine, Ser: 1.25 mg/dL — ABNORMAL HIGH (ref 0.44–1.00)
GFR calc Af Amer: 55 mL/min — ABNORMAL LOW (ref >60)
GFR calc non Af Amer: 47 mL/min — ABNORMAL LOW (ref >60)
Glucose, Bld: 127 mg/dL — ABNORMAL HIGH (ref 65–99)
POTASSIUM: 3.9 mmol/L (ref 3.5–5.1)
SODIUM: 137 mmol/L (ref 135–145)

## 2016-05-23 LAB — HEMOGLOBIN A1C
Hgb A1c MFr Bld: 7.5 % — ABNORMAL HIGH (ref 4.8–5.6)
MEAN PLASMA GLUCOSE: 169 mg/dL

## 2016-05-23 LAB — BRAIN NATRIURETIC PEPTIDE: B NATRIURETIC PEPTIDE 5: 120.3 pg/mL — AB (ref 0.0–100.0)

## 2016-05-23 MED ORDER — MAGIC MOUTHWASH W/LIDOCAINE
10.0000 mL | Freq: Three times a day (TID) | ORAL | Status: DC | PRN
  Filled 2016-05-23: qty 10

## 2016-05-23 MED ORDER — POLYETHYLENE GLYCOL 3350 17 G PO PACK
17.0000 g | PACK | Freq: Every day | ORAL | Status: DC
  Administered 2016-05-24 – 2016-05-28 (×5): 17 g via ORAL
  Filled 2016-05-23 (×7): qty 1

## 2016-05-23 MED ORDER — REGADENOSON 0.4 MG/5ML IV SOLN
0.4000 mg | Freq: Once | INTRAVENOUS | Status: AC
  Administered 2016-05-23: 0.4 mg via INTRAVENOUS
  Filled 2016-05-23: qty 5

## 2016-05-23 MED ORDER — REGADENOSON 0.4 MG/5ML IV SOLN
INTRAVENOUS | Status: AC
  Filled 2016-05-23: qty 5

## 2016-05-23 MED ORDER — GI COCKTAIL ~~LOC~~
30.0000 mL | Freq: Three times a day (TID) | ORAL | Status: DC | PRN
  Administered 2016-05-23 – 2016-05-24 (×3): 30 mL via ORAL
  Filled 2016-05-23 (×3): qty 30

## 2016-05-23 MED ORDER — FAMOTIDINE 20 MG PO TABS
40.0000 mg | ORAL_TABLET | Freq: Two times a day (BID) | ORAL | Status: DC
  Administered 2016-05-23 – 2016-05-30 (×13): 40 mg via ORAL
  Filled 2016-05-23 (×15): qty 2

## 2016-05-23 NOTE — Progress Notes (Signed)
Pt c/o epigastric pain that is a 10 out of 10. Some relief with elevation of head. "Cuts off her breath". VS 142/72-79-20. O2 sat 100% on Room Air. Skin warm and dry. States it's the same pain she had at home that brought her in. Telemetry shows NSR with occ PAC. MD notified and saw pt with new orders obtained. Melton Alarana A Tylynn Braniff, RN

## 2016-05-23 NOTE — Progress Notes (Signed)
PROGRESS NOTE [facility]   Amber Singris Duprey   JWJ:[id number]:[id number] DOB: 12/24/1960  DOA: 05/19/2016 PCP: Elpidio EricLORD, RICHARD, MD   Brief Narrative:  Amber Melendez is a 56 yo obese female with PMH of HTN and DM2 who presented to the ED on 4/30 with CC of LE edema, worse on RLE, orthopnea, chest pressure, DOE, and progressive SOB that was worse with laying down and better with sitting up. Patient was found to have signs of pulmonary edema and admitted for newly found diagnosis of CHF with exacerbation and started on aggressive diuresis.   Subjective: Patient was seen and examined at bedside. She was tearful and upset, complains of epigastric pain that is squeezing in quality which makes her feel like she can't breathe/is drowning. Reports dyspnea and SOB. Symptoms worse with laying down and after meals, slightly relieved with elevation of head/sitting up. Denies CP, N, V, D. Also reports absence of BMs since admission (4/30).  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 BNP 4/30 was 249, repeat BNP 5/4 120. -CXR 4/30 and 5/3 showed cardiomegaly, pulmonary vascular congestion, and early interstital edema. -Troponin was elevated on admission d/t acute decompensation HF. Troponin levels flat and EKG with no changes. -RLE slightly more swollen/tender than LLE. VAS US LE doppler ordered to r/o DVT in RLE. -Continue lasix, aldactone, losartan, and coreg. Monitor BP. -Strict I&Os (-3.2 L), fluid restriction, low-salt diet, monitor daily weights (153.5 --> 155.2 kg) -Cardiology  recommendations appreciated. Nuclear stress done being done 5/4.   GERD -Likely d/t hiatal hernia noticed on palpation. Eating habits, obesity, smoking, etc also contributing factors. -US of abdomen ordered to rule out pathology related to gallbladder, liver, and pancreas. -Increased Pepcid dose to 40 mg, started GI cocktail prn and miralax for constipation  Essential Hypertension -Remains fairly stable, SBP 120-150s, monitor -Continue coreg, lasix, aldactone. Losartan dose increased to 50 mg  Diabetes mellitus, type 2 -A1c 7.5, continue CABG, SSI. Outpatient follow-up recommended. -Carb-modified and heart healthy diet  Tobacco abuse -Cessation discussed and encouraged, continue nicotine gum  Acute Kidney Injury -Likely secondary to cardiorenal syndrome -Continue diuresis w/lasix. Scr and GFR continue to gradually improve. Urine Cr and Na WNL. -Check BMP in the AM  DVT prophylaxis: Lovenox Code Status: FULL Family Communication: Family member at bedside Disposition Plan: Home when pt is medically cleared  Consultants:   Cardiology  Procedures:   Echo 5/1: Mod-severe reduction in LV systolic function, LVEF 30-35%, moderate diastolic dysfunction, and bi-atrial enlargement.  Nuclear Stress Test 5/4: Results pending  VAS US LE Doppler 5/5: Pending  Antimicrobials:  None  Objective: Vitals:   05/23/16 0934 05/23/16 0936 05/23/16 0937 05/23/16 1237  BP: 132/72 (!) 141/62  (!) 162/71  Pulse: 75 72 66 66  Resp:    20  Temp:      TempSrc:      SpO2:    96%  Weight:      Height:        Intake/Output Summary (Last 24 hours) at 05/23/16 1437 Last data filed at 05/22/16 2200  Gross per 24  hour  Intake              450 ml  Output              600 ml  Net             -150 ml   Filed Weights   05/21/16 0524 05/22/16 0554 05/23/16 0538  Weight: (!) 155 kg (341 lb 12.8 oz) (!) 154.2 kg (339 lb 14.4 oz) (!) 155.2 kg (342 lb 3.2 oz)    Examination:  General exam:  Appears tearful HEENT: Atraumatic and normocephalic Respiratory system: Breath sounds distant d/t large body habitus Cardiovascular system: S1/S2 heard, RRR. No murmurs heard. Heart sounds distant also. Gastrointestinal system: Abdomen is nondistended, soft and nontender. Pt is obese. Probable hiatal hernia noted in LUQ, tender to palpation. Central nervous system: Alert and oriented.  Extremities: Trace pedal edema, RLE appears more swollen and tender than LLE Skin: No lesions Psychiatry: Judgement and insight appear normal. Mood & affect appropriate.   Data Reviewed: I have personally reviewed following labs and imaging studies  CBC:  Recent Labs Lab 05/19/16 1855 05/22/16 0553  WBC 12.2* 10.4  NEUTROABS 9.0* 6.2  HGB 13.5 13.5  HCT 41.3 41.0  MCV 81.8 82.5  PLT 299 285   Basic Metabolic Panel:  Recent Labs Lab 05/19/16 1855 05/20/16 0541 05/21/16 0528 05/22/16 0553 05/23/16 0634  NA 138 139 138 138 137  K 4.0 3.8 3.8 3.5 3.9  CL 102 102 100* 99* 101  CO2 26 27 27 27 26   GLUCOSE 136* 140* 145* 130* 127*  BUN 12 15 18 20 17   CREATININE 1.09* 1.09* 1.22* 1.33* 1.25*  CALCIUM 9.5 9.4 9.3 9.0 9.0   GFR: Estimated Creatinine Clearance: 79.2 mL/min (A) (by C-G formula based on SCr of 1.25 mg/dL (H)). Liver Function Tests: No results for input(s): AST, ALT, ALKPHOS, BILITOT, PROT, ALBUMIN in the last 168 hours. No results for input(s): LIPASE, AMYLASE in the last 168 hours. No results for input(s): AMMONIA in the last 168 hours. Coagulation Profile: No results for input(s): INR, PROTIME in the last 168 hours. Cardiac Enzymes:  Recent Labs Lab 05/19/16 1855 05/20/16 1022  TROPONINI 0.03* <0.03   BNP (last 3 results) No results for input(s): PROBNP in the last 8760 hours. HbA1C:  Recent Labs  05/22/16 0553  HGBA1C 7.5*   CBG:  Recent Labs Lab 05/22/16 1159 05/22/16 1702 05/22/16 2042 05/23/16 0739 05/23/16 1213  GLUCAP 148* 114* 116* 130* 112*    Lipid Profile: No results for input(s): CHOL, HDL, LDLCALC, TRIG, CHOLHDL, LDLDIRECT in the last 72 hours. Thyroid Function Tests: No results for input(s): TSH, T4TOTAL, FREET4, T3FREE, THYROIDAB in the last 72 hours. Anemia Panel: No results for input(s): VITAMINB12, FOLATE, FERRITIN, TIBC, IRON, RETICCTPCT in the last 72 hours. Sepsis Labs: No results for input(s): PROCALCITON, LATICACIDVEN in the last 168 hours.  No results found for this or any previous visit (from the past 240 hour(s)).   Radiology Studies: US Renal  Result Date: 05/22/2016 CLINICAL DATA:  Initial evaluation for acute renal injury, elevated BUN and creatinine. History of diabetes, hypertension, obesity. EXAM: RENAL / URINARY TRACT ULTRASOUND COMPLETE COMPARISON:  None. FINDINGS: Right Kidney: Length: 10.9 cm. Echogenicity within normal limits. No mass or hydronephrosis visualized. Left Kidney: Length: 10.8 cm. Echogenicity within normal limits. No mass or hydronephrosis visualized. Bladder: Visualized due to body habitus. IMPRESSION: Normal renal ultrasound.  No hydronephrosis. Electronically Signed   By: Rise Mu  M.D.   On: 05/22/2016 21:25   Dg Chest Port 1 View  Result Date: 05/22/2016 CLINICAL DATA:  Acute exacerbation of CHF EXAM: PORTABLE CHEST 1 VIEW COMPARISON:  05/19/2016 FINDINGS: Cardiomegaly with vascular congestion. Mild interstitial prominence may reflect interstitial edema. No effusions. IMPRESSION: Cardiomegaly with vascular congestion. Possible early interstitial edema. Electronically Signed   By: Charlett Nose M.D.   On: 05/22/2016 11:32   Scheduled Meds: . carvedilol  3.125 mg Oral BID WC  . enoxaparin (LOVENOX) injection  0.5 mg/kg Subcutaneous Q24H  . famotidine  40 mg Oral BID  . FLUoxetine  40 mg Oral Daily  . furosemide  40 mg Oral BID  . gabapentin  600 mg Oral Daily  . gabapentin  900 mg Oral QHS  . insulin aspart protamine- aspart  35 Units Subcutaneous BID WC  . losartan  50  mg Oral Daily  . sodium chloride flush  3 mL Intravenous Q12H  . spironolactone  12.5 mg Oral Daily   Continuous Infusions: . sodium chloride       LOS: 2 days   Floreen Comber, PA-S  Attending MD note  Patient was seen, examined,treatment plan was discussed with the PA-S Floreen Comber.  I have personally reviewed the clinical findings, lab, imaging studies and management of this patient in detail. I agree with the documentation, as recorded by the PA-S and changes to above note were in bold green  Brief  Patient is 29 mL-year-old female with past medical history of hypertension diabetes presented to the emergency department with lower extremity edema, orthopnea and generalized breath. She was admitted with findings of pulmonary edema and was found to have new diagnosis of CHF with low ejection fraction. Cardiology was consulted. Patient on IV diuresis and BP control. Cardiology planning for nuclear stress test.  Patient seen and examined, c/o epigastric pain started after eating and lying down. Patient also report some regurgitation. Pain improve with sitting up.   On Exam: Gen. exam: NAD  Chest: Good air entry improved from yesterday  CVS: S1-S2 regular, no murmurs, trace pedal edema Abdomen: Obese, epigastric discomfort on palpation. + BS, + hiatal hernia   Neurology: Non-focal Skin: No rash or lesions  Plan Acute combining systolic and diastolic congestive heart failure Continue current regimen: BB, ARB, Aldactone and Lasix  Strict I&O's, fluid restriction, monitor daily weights Patient went for lexi scan, first part today, second part tomorrow in AM  D/c pending cardiology clearance  Substernal pain unlikely to be cardiac in origin   GERD - in view of obesity and hiatal hernia  GI cocktail  Pepcid 40 mg BID Continue to monitor   Acute kidney injury -  Improving - likely to be cardiorenal syndrome as chest x-ray shows signs of pulmonary edema. Continue  Lasix 40 mg BID - can transition to PO in AM if continues with good diuresis.   Essential hypertension - currently stable Patient on beta blocker, ARB's, Aldactone and Lasix Monitor BP  Tobacco abuse Cessation discussed  Latrelle Dodrill, MD   If 7PM-7AM, please contact night-coverage [email or web address] Password [facility] 05/23/2016, 2:37 PM

## 2016-05-23 NOTE — Progress Notes (Signed)
   Amber Melendez presented for a Lexiscan cardiolite today.  No immediate complications.  Stress imaging is pending at this time. Stress images done today.  Resting images planned for tomorrow.   Amber PageLindsay Jamillia Closson, NP 05/23/2016, 9:37 AM

## 2016-05-23 NOTE — Progress Notes (Signed)
[facility].  Further recs pending results of stress test

## 2016-05-23 NOTE — Progress Notes (Deleted)
Progress Note  Patient Name: Amber Melendez Date of Encounter: 05/23/2016  Primary Cardiologist: Dr. Mayford Knife  Subjective   No complaints of chest pain or SOB today  Inpatient Medications    Scheduled Meds: . carvedilol  3.125 mg Oral BID WC  . enoxaparin (LOVENOX) injection  0.5 mg/kg Subcutaneous Q24H  . famotidine  20 mg Oral BID  . FLUoxetine  40 mg Oral Daily  . furosemide  40 mg Oral BID  . gabapentin  600 mg Oral Daily  . gabapentin  900 mg Oral QHS  . insulin aspart protamine- aspart  35 Units Subcutaneous BID WC  . losartan  50 mg Oral Daily  . regadenoson  0.4 mg Intravenous Once  . sodium chloride flush  3 mL Intravenous Q12H  . spironolactone  12.5 mg Oral Daily   Continuous Infusions: . sodium chloride     PRN Meds: sodium chloride, acetaminophen, albuterol, diphenhydrAMINE, nicotine polacrilex, ondansetron (ZOFRAN) IV, oxycodone, sodium chloride flush   Vital Signs    Vitals:   05/22/16 2047 05/23/16 0538 05/23/16 0848 05/23/16 0908  BP: (!) 142/83 (!) 158/53 120/67   Pulse: 69 65    Resp: 18 18    Temp: 98.4 F (36.9 C) 98.5 F (36.9 C)    TempSrc: Oral Oral    SpO2: 98% 99%  93%  Weight:  (!) 342 lb 3.2 oz (155.2 kg)    Height:        Intake/Output Summary (Last 24 hours) at 05/23/16 0916 Last data filed at 05/22/16 2200  Gross per 24 hour  Intake              790 ml  Output              800 ml  Net              -10 ml   Filed Weights   05/21/16 0524 05/22/16 0554 05/23/16 0538  Weight: (!) 341 lb 12.8 oz (155 kg) (!) 339 lb 14.4 oz (154.2 kg) (!) 342 lb 3.2 oz (155.2 kg)    Telemetry    NSR - Personally Reviewed  ECG    NSR with limb lead reversal - Personally Reviewed  Physical Exam   GEN: No acute distress.   Neck: No JVD Cardiac: RRR, no murmurs, rubs, or gallops.  Respiratory: Clear to auscultation bilaterally. GI: Soft, nontender, non-distended  MS: No edema; No deformity. Neuro:  Nonfocal  Psych: Normal affect   Labs      Chemistry Recent Labs Lab 05/21/16 0528 05/22/16 0553 05/23/16 0634  NA 138 138 137  K 3.8 3.5 3.9  CL 100* 99* 101  CO2 27 27 26   GLUCOSE 145* 130* 127*  BUN 18 20 17   CREATININE 1.22* 1.33* 1.25*  CALCIUM 9.3 9.0 9.0  GFRNONAA 49* 44* 47*  GFRAA 56* 51* 55*  ANIONGAP 11 12 10      Hematology Recent Labs Lab 05/19/16 1855 05/22/16 0553  WBC 12.2* 10.4  RBC 5.05 4.97  HGB 13.5 13.5  HCT 41.3 41.0  MCV 81.8 82.5  MCH 26.7 27.2  MCHC 32.7 32.9  RDW 16.5* 16.2*  PLT 299 285    Cardiac Enzymes Recent Labs Lab 05/19/16 1855 05/20/16 1022  TROPONINI 0.03* <0.03   No results for input(s): TROPIPOC in the last 168 hours.   BNP Recent Labs Lab 05/19/16 1855 05/23/16 0634  BNP 249.4* 120.3*     DDimer No results for input(s): DDIMER in the last  168 hours.   Radiology    US Renal  Result Date: 05/22/2016 CLINICAL DATA:  Initial evaluation for acute renal injury, elevated BUN and creatinine. History of diabetes, hypertension, obesity. EXAM: RENAL / URINARY TRACT ULTRASOUND COMPLETE COMPARISON:  None. FINDINGS: Right Kidney: Length: 10.9 cm. Echogenicity within normal limits. No mass or hydronephrosis visualized. Left Kidney: Length: 10.8 cm. Echogenicity within normal limits. No mass or hydronephrosis visualized. Bladder: Visualized due to body habitus. IMPRESSION: Normal renal ultrasound.  No hydronephrosis. Electronically Signed   By: Rise Mu M.D.   On: 05/22/2016 21:25   Dg Chest Port 1 View  Result Date: 05/22/2016 CLINICAL DATA:  Acute exacerbation of CHF EXAM: PORTABLE CHEST 1 VIEW COMPARISON:  05/19/2016 FINDINGS: Cardiomegaly with vascular congestion. Mild interstitial prominence may reflect interstitial edema. No effusions. IMPRESSION: Cardiomegaly with vascular congestion. Possible early interstitial edema. Electronically Signed   By: Charlett Nose M.D.   On: 05/22/2016 11:32    Cardiac Studies   05/20/2016 2D echo Study Conclusions  -  Left ventricle: The cavity size was normal. Wall thickness was   normal. Systolic function was moderately to severely reduced. The   estimated ejection fraction was in the range of 30% to 35%.   Diffuse hypokinesis. Features are consistent with a pseudonormal   left ventricular filling pattern, with concomitant abnormal   relaxation and increased filling pressure (grade 2 diastolic   dysfunction). - Left atrium: The atrium was moderately dilated. - Right atrium: The atrium was mildly dilated.  Impressions:  - Moderate to severe global reduction in LV systolic function;   moderate diastolic dysfunction; biatrial enlargement.  Patient Profile     56 y.o. female  with a PMH significant for HTN, DM, arthritis, and anxiety.  She has no cardiac history but has a family history of premature CAD with her sister dying in her 53's of an MI.  She also smokes 1/2 ppd of cigarettes.  She presented to the ER with complaints of LE edema, orthopnea and DOE and CXRAY showed pulmonary edema.  She also complained of chest discomfort that she describes as a cramp in her epigastric region and atypical in that it improves with massage of the area.  2D echo showed moderately reduced LVF with EF 30-35% with diffuse HK and moderate BAE.  Trop is neg x 2 and BNP elevated at 249.  She tells me that she had a severe viral respiratory infection a month ago and is still recovering from it so this   Assessment & Plan    1. 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 Viral CM as she had a severe viral respiratory infection about a month ago - BNP 249.4 on admission - troponin x 2 negative (0.03 --> <0.03) - EKG with NSR with frequent ectopy - overall net negative 1.5L with 800cc urine output yesterday  - weight on admission was 338 lbs, today she is 342 lbs - dry weight not yet determined - poorly controlled HTN likely contributing - She does not appear markedly volume overloaded  today.  There is no LE edema and no appreciable JVD although difficult to assess due to obesity.   - I had a long conversation about lifestyle modifications and the importance of medication compliance. - continue Lasix to 40mg  BID PO  2.  Uncontrolled hypertension 159/105 on day of admission - BP has improved and 120/61mmHg today - may be due to noncompliance with meds and sodium - BB  changed to Carvedilol given her underlying LV dysfunction - continue ARB and aldactone.  3.  Chest pain - She reports significant epigastric pain that feels like a cramping pain or pressure. She also described pain in her lower chest that felt like someone was sitting on her chest. She does not describe typical anginal symptoms, and it is unclear if these symptoms are related to underlying poorly controlled HTN and CHF or underlying CAD. However, in an obese patient with diabetes, anginal symptoms may vary. Given her risk factors of obesity, smoking, HTN, DM, and family history of heart disease, will likely need further ischemic evaluation. Her body habitus precludes CT angiogram. She is NPO for 2 day Lexiscan myoview.  4. DM - per primary team - A1c 7.5 - per IM  5. AKI - sCr 1.25 (1.09 admit) - monitor kidney function on PO diuretics - if sCr continues to trend up any further, will back off on lasix   Signed, Armanda Magicraci Shemicka Cohrs, MD  05/23/2016, 9:16 AM

## 2016-05-23 NOTE — Progress Notes (Signed)
[facility] for Stress Test. Pt did c/o shortness of breath with activity. Some expiratory wheezing noted. O2 sat staying in upper 90's.  VSS. Breathing treatment given during transport. Melton Alarana A Alitza Cowman, RN

## 2016-05-23 NOTE — Progress Notes (Signed)
[facility]. Condition stable. Voicing no c/o at present. Melton Alarana A Cyrus Ramsburg, RN

## 2016-05-24 ENCOUNTER — Other Ambulatory Visit: Payer: Self-pay

## 2016-05-24 ENCOUNTER — Encounter (HOSPITAL_COMMUNITY): Payer: Medicare Other

## 2016-05-24 LAB — BASIC METABOLIC PANEL
Anion gap: 10 (ref 5–15)
BUN: 14 mg/dL (ref 6–20)
CHLORIDE: 101 mmol/L (ref 101–111)
CO2: 26 mmol/L (ref 22–32)
Calcium: 9.1 mg/dL (ref 8.9–10.3)
Creatinine, Ser: 1.16 mg/dL — ABNORMAL HIGH (ref 0.44–1.00)
GFR calc Af Amer: 60 mL/min — ABNORMAL LOW (ref >60)
GFR calc non Af Amer: 52 mL/min — ABNORMAL LOW (ref >60)
GLUCOSE: 136 mg/dL — AB (ref 65–99)
POTASSIUM: 3.9 mmol/L (ref 3.5–5.1)
Sodium: 137 mmol/L (ref 135–145)

## 2016-05-24 LAB — NM MYOCAR MULTI W/SPECT W/WALL MOTION / EF
CHL CUP NUCLEAR SDS: 19
CHL CUP NUCLEAR SRS: 8
CHL CUP NUCLEAR SSS: 27
CHL CUP RESTING HR STRESS: 67 {beats}/min
CHL CUP STRESS STAGE 1 HR: 68 {beats}/min
CHL CUP STRESS STAGE 1 SBP: 120 mmHg
CHL CUP STRESS STAGE 1 SPEED: 0 mph
CHL CUP STRESS STAGE 2 GRADE: 0 %
CHL CUP STRESS STAGE 2 HR: 66 {beats}/min
CHL CUP STRESS STAGE 2 SPEED: 0 mph
CHL CUP STRESS STAGE 3 DBP: 72 mmHg
CHL CUP STRESS STAGE 4 SPEED: 0 mph
CSEPED: 5 min
Estimated workload: 1 METS
LHR: 0.5
LV dias vol: 159 mL (ref 46–106)
LV sys vol: 88 mL
MPHR: 164 {beats}/min
Peak BP: 141 mmHg
Peak HR: 75 {beats}/min
Percent HR: 48 %
Percent of predicted max HR: 45 %
Stage 1 DBP: 67 mmHg
Stage 1 Grade: 0 %
Stage 3 Grade: 0 %
Stage 3 HR: 73 {beats}/min
Stage 3 SBP: 132 mmHg
Stage 3 Speed: 0 mph
Stage 4 DBP: 62 mmHg
Stage 4 Grade: 0 %
Stage 4 HR: 75 {beats}/min
Stage 4 SBP: 141 mmHg
TID: 0.89

## 2016-05-24 LAB — GLUCOSE, CAPILLARY
Glucose-Capillary: 137 mg/dL — ABNORMAL HIGH (ref 65–99)
Glucose-Capillary: 131 mg/dL — ABNORMAL HIGH (ref 65–99)
Glucose-Capillary: 147 mg/dL — ABNORMAL HIGH (ref 65–99)
Glucose-Capillary: 127 mg/dL — ABNORMAL HIGH (ref 65–99)

## 2016-05-24 MED ORDER — SPIRONOLACTONE 25 MG PO TABS: 12.5000 mg | ORAL_TABLET | Freq: Every day | ORAL | 0 refills | Status: DC

## 2016-05-24 MED ORDER — FUROSEMIDE 40 MG PO TABS: 40.0000 mg | ORAL_TABLET | Freq: Two times a day (BID) | ORAL | 0 refills | Status: DC

## 2016-05-24 MED ORDER — MENTHOL 3 MG MT LOZG
1.0000 | LOZENGE | OROMUCOSAL | Status: DC | PRN
  Administered 2016-05-28: 3 mg via ORAL
  Filled 2016-05-24 (×4): qty 9

## 2016-05-24 MED ORDER — TECHNETIUM TC 99M TETROFOSMIN IV KIT
30.0000 | PACK | Freq: Once | INTRAVENOUS | Status: AC | PRN
  Administered 2016-05-23: 30 via INTRAVENOUS

## 2016-05-24 MED ORDER — CARVEDILOL 3.125 MG PO TABS
3.1250 mg | ORAL_TABLET | Freq: Two times a day (BID) | ORAL | 0 refills | Status: DC
Start: 2016-05-24 — End: 2016-05-30

## 2016-05-24 MED ORDER — LOSARTAN POTASSIUM 50 MG PO TABS: 50.0000 mg | ORAL_TABLET | Freq: Every day | ORAL | 0 refills | Status: DC

## 2016-05-24 MED ORDER — OMEPRAZOLE 40 MG PO CPDR: 40.0000 mg | DELAYED_RELEASE_CAPSULE | Freq: Two times a day (BID) | ORAL | 0 refills | Status: AC

## 2016-05-24 MED ORDER — NICOTINE POLACRILEX 2 MG MT GUM: 2.0000 mg | CHEWING_GUM | OROMUCOSAL | 0 refills | Status: DC | PRN

## 2016-05-24 MED ORDER — TECHNETIUM TC 99M TETROFOSMIN IV KIT
30.0000 | PACK | Freq: Once | INTRAVENOUS | Status: AC | PRN
  Administered 2016-05-24: 30 via INTRAVENOUS

## 2016-05-24 NOTE — Progress Notes (Addendum)
PROGRESS NOTE [facility]   Amber Melendez   ZOX:[id number] DOB: [birth year]  DOA: 05/19/2016 PCP: Elpidio Eric, MD   Brief Narrative:  Patient is 38 mL-year-old female with past medical history of hypertension diabetes presented to the emergency department with lower extremity edema, orthopnea and generalized breath. She was admitted with findings of pulmonary edema and was found to have new diagnosis of CHF with low ejection fraction. Cardiology was consulted recommended nuclear stress tests which came high risk for CAD. Now recommending cardiac cath.   Subjective: Patient seen and examined report feeling much better epigastric pain has improved faster dilation of GI cocktail and increasing Pepcid. No complaints this morning. Breathing continues to improve.  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 Strict I&O's, fluid restriction, monitor daily weights Nuclear stress test came high risk for ischemia, cardiology planning for cardiac cath on Monday. Patient high risk for CAD, smoker, obese, diabetes mellitus, hypertension and family history of early cardiac death.  GERD - in view of obesity and hiatal hernia - improved Continue current regimen: GI cocktail, Pepcid 40 mg BID Continue to monitor   Acute kidney injury -  Improving - likely to be cardiorenal syndrome as chest x-ray shows signs of pulmonary edema. Continues to improve with diuresis.  Continue Lasix 40 mg twice a day.  Essential hypertension- currently stable Patient on beta blocker, ARB's, Aldactone and Lasix Monitor BP  Tobacco abuse Cessation discussed Nicotine gum   Lower extremity edema right>left Doppler ultrasound ordered to rule out PE as patient have calf tenderness as well.  Diabetes mellitus type 2 - CBGs currently stable Continue current regimen Monitor  CBGs.  DVT prophylaxis: Lovenox Code Status: Full Family Communication: Family member at bedside Disposition Plan: Home when cardiology cleared  Consultants:   Cardiology  Procedures:   ECHO 05/20/16 ------------------------------------------------------------------- Study Conclusions  - Left ventricle: The cavity size was normal. Wall thickness was   normal. Systolic function was moderately to severely reduced. The   estimated ejection fraction was in the range of 30% to 35%.   Diffuse hypokinesis. Features are consistent with a pseudonormal   left ventricular filling pattern, with concomitant abnormal   relaxation and increased filling pressure (grade 2 diastolic   dysfunction). - Left atrium: The atrium was moderately dilated. - Right atrium: The atrium was mildly dilated.  Impressions:  - Moderate to severe global reduction in LV systolic function;   moderate diastolic dysfunction; biatrial enlargement.  Antimicrobials: Anti-infectives    None         Objective: Vitals:   05/23/16 2204 05/24/16 0506 05/24/16 1400 05/24/16 1713  BP: (!) 118/49 (!) 143/85 135/79 132/71  Pulse: (!) 58 73 77 60  Resp: 20 20 18    Temp: 98.1 F (36.7 C) 98.3 F (36.8 C) 98.5 F (36.9 C)   TempSrc: Oral Oral Oral   SpO2: 95% 95% 97%   Weight:  (!) 154 kg (339 lb 8 oz)    Height:       No intake or output data in the 24 hours ending 05/24/16 1736 Filed Weights   05/22/16 0554 05/23/16 0538 05/24/16 0506  Weight: (!) 154.2 kg (339 lb 14.4 oz) (!) 155.2 kg (342 lb 3.2 oz) (!) 154 kg (339 lb 8 oz)    Examination:  General exam: Appears calm and comfortable  HEENT: AC/AT Respiratory system: Good air entry, distant sounds due to  body habitus. Breath sounds improved Cardiovascular system: S1 & S2 heard, RRR. No JVD, murmurs, rubs or gallops Gastrointestinal system: Severely obese, nondistended nontender. Central nervous system: Alert and oriented. No focal neurological  deficits. Extremities: Lower extremity trace edema bilaterally right greater than left   Skin: No rashes, lesions or ulcers Psychiatry: Judgement and insight appear normal. Mood & affect appropriate.    Data Reviewed: I have personally reviewed following labs and imaging studies  CBC:  Recent Labs Lab 05/19/16 1855 05/22/16 0553  WBC 12.2* 10.4  NEUTROABS 9.0* 6.2  HGB 13.5 13.5  HCT 41.3 41.0  MCV 81.8 82.5  PLT 299 285   Basic Metabolic Panel:  Recent Labs Lab 05/20/16 0541 05/21/16 0528 05/22/16 0553 05/23/16 0634 05/24/16 0611  NA 139 138 138 137 137  K 3.8 3.8 3.5 3.9 3.9  CL 102 100* 99* 101 101  CO2 27 27 27 26 26   GLUCOSE 140* 145* 130* 127* 136*  BUN 15 18 20 17 14   CREATININE 1.09* 1.22* 1.33* 1.25* 1.16*  CALCIUM 9.4 9.3 9.0 9.0 9.1   GFR: Estimated Creatinine Clearance: 84.9 mL/min (A) (by C-G formula based on SCr of 1.16 mg/dL (H)). Liver Function Tests: No results for input(s): AST, ALT, ALKPHOS, BILITOT, PROT, ALBUMIN in the last 168 hours. No results for input(s): LIPASE, AMYLASE in the last 168 hours. No results for input(s): AMMONIA in the last 168 hours. Coagulation Profile: No results for input(s): INR, PROTIME in the last 168 hours. Cardiac Enzymes:  Recent Labs Lab 05/19/16 1855 05/20/16 1022  TROPONINI 0.03* <0.03   BNP (last 3 results) No results for input(s): PROBNP in the last 8760 hours. HbA1C:  Recent Labs  05/22/16 0553  HGBA1C 7.5*   CBG:  Recent Labs Lab 05/23/16 1718 05/23/16 2203 05/24/16 0737 05/24/16 1220 05/24/16 1641  GLUCAP 116* 130* 137* 131* 147*   Lipid Profile: No results for input(s): CHOL, HDL, LDLCALC, TRIG, CHOLHDL, LDLDIRECT in the last 72 hours. Thyroid Function Tests: No results for input(s): TSH, T4TOTAL, FREET4, T3FREE, THYROIDAB in the last 72 hours. Anemia Panel: No results for input(s): VITAMINB12, FOLATE, FERRITIN, TIBC, IRON, RETICCTPCT in the last 72 hours. Sepsis Labs: No  results for input(s): PROCALCITON, LATICACIDVEN in the last 168 hours.  No results found for this or any previous visit (from the past 240 hour(s)).       Radiology Studies: [place] Abdomen Complete  Result Date: 05/23/2016 CLINICAL DATA:  Abdominal pain. EXAM: ABDOMEN ULTRASOUND COMPLETE COMPARISON:  None. FINDINGS: Gallbladder: No gallstones or wall thickening visualized. No sonographic Murphy sign noted by sonographer. Common bile duct: Diameter: 3.7 mm Liver: Diffuse increased echogenicity with no suspicious masses. A 2.9 cm cyst is seen in the right hepatic lobe. IVC: No abnormality visualized. Pancreas: Visualized portion unremarkable. Spleen: Size and appearance within normal limits. Right Kidney: Length: 11.2 cm. Echogenicity within normal limits. No mass or hydronephrosis visualized. Left Kidney: Length: 11.5 cm. Echogenicity within normal limits. No mass or hydronephrosis visualized. Abdominal aorta: No aneurysm visualized. Other findings: None. IMPRESSION: 1. Probable hepatic steatosis. No other abnormalities identified. The study was limited by patient body habitus and shadowing bowel gas. Electronically Signed   By: Gerome Samavid  Williams III M.D   On: 05/23/2016 21:34   [place] Renal  Result Date: 05/22/2016 CLINICAL DATA:  Initial evaluation for acute renal injury, elevated BUN and creatinine. History of diabetes, hypertension, obesity. EXAM: RENAL / URINARY TRACT ULTRASOUND COMPLETE COMPARISON:  None. FINDINGS: Right Kidney: Length: 10.9 cm. Echogenicity  within normal limits. No mass or hydronephrosis visualized. Left Kidney: Length: 10.8 cm. Echogenicity within normal limits. No mass or hydronephrosis visualized. Bladder: Visualized due to body habitus. IMPRESSION: Normal renal ultrasound.  No hydronephrosis. Electronically Signed   By: Rise Mu M.D.   On: 05/22/2016 21:25   Nm Myocar Multi W/spect W/wall Motion / Ef  Addendum Date: 05/24/2016    There was no ST segment deviation noted  during stress.  No T wave inversion was noted during stress.  There is a large defect of severe severity present in the basal anterior, basal anteroseptal, basal inferoseptal, basal inferior, basal inferolateral, basal anterolateral, mid anterior, mid anteroseptal, mid inferoseptal, mid inferior, mid inferolateral, mid anterolateral, apical anterior, apical inferior, apical lateral and apex location. The defect is reversible and consistent with ischemia. This is a very poor quality study and counts are diffusely low in all areas on stress images.  The left ventricular ejection fraction is mildly decreased (45-54%).  Nuclear stress EF: 45%.  Unclear whether this represents true ischemia in all regions or is due to poor image quality in processing on stress imaging. In setting of LV dysfunction, recommend cardiac cath for further evaluation for CAD  Findings consistent with ischemia.  This is a high risk study.    Result Date: 05/24/2016  There was no ST segment deviation noted during stress.  No T wave inversion was noted during stress.  There is a large defect of severe severity present in the basal anterior, basal anteroseptal, basal inferoseptal, basal inferior, basal inferolateral, basal anterolateral, mid anterior, mid anteroseptal, mid inferoseptal, mid inferior, mid inferolateral, mid anterolateral, apical anterior, apical inferior, apical lateral and apex location. The defect is reversible and consistent with ischemia. This is a very poor quality study and counts are diffusely low in all areas on stress images.  The left ventricular ejection fraction is mildly decreased (45-54%).  Nuclear stress EF: 45%.  Unclear whether this represents true ischemia in all regions or is due to poor image quality in processing on stress imaging. In setting of LV dysfunction, recommend cardiac cath for further evaluation for CAD       Scheduled Meds: . carvedilol  3.125 mg Oral BID WC  . enoxaparin  (LOVENOX) injection  0.5 mg/kg Subcutaneous Q24H  . famotidine  40 mg Oral BID  . FLUoxetine  40 mg Oral Daily  . furosemide  40 mg Oral BID  . gabapentin  600 mg Oral Daily  . gabapentin  900 mg Oral QHS  . insulin aspart protamine- aspart  35 Units Subcutaneous BID WC  . losartan  50 mg Oral Daily  . polyethylene glycol  17 g Oral Daily  . sodium chloride flush  3 mL Intravenous Q12H  . spironolactone  12.5 mg Oral Daily   Continuous Infusions: . sodium chloride       LOS: 3 days    Latrelle Dodrill, MD Pager: Text Page via [email or web address]  [phone]  If 7PM-7AM, please contact night-coverage [email or web address] Password [email or web address] 05/24/2016, 5:36 PM

## 2016-05-24 NOTE — Progress Notes (Signed)
Abnormal Lexiscan myoview with high risk scan.  Recommend cath on Monday.  Will discuss with patient in detail on rounds in am.

## 2016-05-24 NOTE — Progress Notes (Signed)
Await results of nuclear stress test.

## 2016-05-25 ENCOUNTER — Inpatient Hospital Stay (HOSPITAL_COMMUNITY): Payer: Medicare Other

## 2016-05-25 DIAGNOSIS — R609 Edema, unspecified: Secondary | ICD-10-CM

## 2016-05-25 DIAGNOSIS — R778 Other specified abnormalities of plasma proteins: Secondary | ICD-10-CM

## 2016-05-25 DIAGNOSIS — R7989 Other specified abnormal findings of blood chemistry: Secondary | ICD-10-CM

## 2016-05-25 DIAGNOSIS — K219 Gastro-esophageal reflux disease without esophagitis: Secondary | ICD-10-CM

## 2016-05-25 DIAGNOSIS — R748 Abnormal levels of other serum enzymes: Secondary | ICD-10-CM

## 2016-05-25 DIAGNOSIS — I200 Unstable angina: Secondary | ICD-10-CM

## 2016-05-25 LAB — HEPARIN LEVEL (UNFRACTIONATED)
HEPARIN UNFRACTIONATED: 0.37 [IU]/mL (ref 0.30–0.70)
HEPARIN UNFRACTIONATED: 0.24 [IU]/mL — AB (ref 0.30–0.70)

## 2016-05-25 LAB — GLUCOSE, CAPILLARY
GLUCOSE-CAPILLARY: 119 mg/dL — AB (ref 65–99)
Glucose-Capillary: 128 mg/dL — ABNORMAL HIGH (ref 65–99)
Glucose-Capillary: 113 mg/dL — ABNORMAL HIGH (ref 65–99)
Glucose-Capillary: 140 mg/dL — ABNORMAL HIGH (ref 65–99)

## 2016-05-25 LAB — TSH: TSH: 2.721 u[IU]/mL (ref 0.350–4.500)

## 2016-05-25 MED ORDER — SODIUM CHLORIDE 0.9 % IV SOLN
INTRAVENOUS | Status: DC
  Administered 2016-05-27: 06:00:00 via INTRAVENOUS

## 2016-05-25 MED ORDER — HEPARIN (PORCINE) IN NACL 100-0.45 UNIT/ML-% IJ SOLN
1500.0000 [IU]/h | INTRAMUSCULAR | Status: DC
  Administered 2016-05-26: 1500 [IU]/h via INTRAVENOUS
  Filled 2016-05-25: qty 250

## 2016-05-25 MED ORDER — HEPARIN (PORCINE) IN NACL 100-0.45 UNIT/ML-% IJ SOLN
1300.0000 [IU]/h | INTRAMUSCULAR | Status: DC
  Administered 2016-05-25: 1300 [IU]/h via INTRAVENOUS
  Filled 2016-05-25: qty 250

## 2016-05-25 MED ORDER — ASPIRIN 81 MG PO CHEW
81.0000 mg | CHEWABLE_TABLET | Freq: Every day | ORAL | Status: DC
  Administered 2016-05-25 – 2016-05-30 (×6): 81 mg via ORAL
  Filled 2016-05-25 (×7): qty 1

## 2016-05-25 MED ORDER — ASPIRIN 81 MG PO CHEW
81.0000 mg | CHEWABLE_TABLET | ORAL | Status: AC
  Administered 2016-05-26: 81 mg via ORAL

## 2016-05-25 MED ORDER — HEPARIN BOLUS VIA INFUSION
4000.0000 [IU] | Freq: Once | INTRAVENOUS | Status: AC
  Administered 2016-05-25: 4000 [IU] via INTRAVENOUS
  Filled 2016-05-25: qty 4000

## 2016-05-25 MED ORDER — SODIUM CHLORIDE 0.9% FLUSH
3.0000 mL | Freq: Two times a day (BID) | INTRAVENOUS | Status: DC
  Administered 2016-05-25 (×2): 3 mL via INTRAVENOUS

## 2016-05-25 MED ORDER — SODIUM CHLORIDE 0.9 % IV SOLN: 250.0000 mL | INTRAVENOUS | Status: DC | PRN

## 2016-05-25 MED ORDER — SODIUM CHLORIDE 0.9% FLUSH: 3.0000 mL | INTRAVENOUS | Status: DC | PRN

## 2016-05-25 NOTE — Progress Notes (Signed)
ANTICOAGULATION CONSULT NOTE - follow up  Pharmacy Consult for IV heparin  Indication: chest pain/ACS  Allergies  Allergen Reactions  . Lisinopril Other (See Comments)    Cough    Patient Measurements: Height: 5' 7.5" (171.5 cm) Weight: (!) 341 lb 14.4 oz (155.1 kg) IBW/kg (Calculated) : 62.75 Heparin Dosing Weight: 102  Vital Signs: Temp: 98 F (36.7 C) (05/06 2132) Temp Source: Oral (05/06 2132) BP: 132/68 (05/06 2132) Pulse Rate: 63 (05/06 2132)  Labs:  Recent Labs  05/23/16 0634 05/24/16 0611 05/25/16 1612 05/25/16 2231  HEPARINUNFRC  --   --  0.37 0.24*  CREATININE 1.25* 1.16*  --   --     Estimated Creatinine Clearance: 85.2 mL/min (A) (by C-G formula based on SCr of 1.16 mg/dL (H)).   Medical History: Past Medical History:  Diagnosis Date  . Anxiety   . Arthritis   . Diabetes mellitus without complication (HCC)   . Hypertension     Medications:  Scheduled:  . aspirin  81 mg Oral Daily  . [START ON 05/26/2016] aspirin  81 mg Oral Pre-Cath  . carvedilol  3.125 mg Oral BID WC  . famotidine  40 mg Oral BID  . FLUoxetine  40 mg Oral Daily  . furosemide  40 mg Oral BID  . gabapentin  600 mg Oral Daily  . gabapentin  900 mg Oral QHS  . insulin aspart protamine- aspart  35 Units Subcutaneous BID WC  . losartan  50 mg Oral Daily  . polyethylene glycol  17 g Oral Daily  . sodium chloride flush  3 mL Intravenous Q12H  . sodium chloride flush  3 mL Intravenous Q12H  . spironolactone  12.5 mg Oral Daily   Infusions:  . sodium chloride    . sodium chloride    . [START ON 05/26/2016] sodium chloride    . heparin      Assessment: 56 yo female with new onset HF followed by cards as inpatient. Now to start IV heparin for new CP for possible ACS with plan for heart cath tomorrow 5/7. 5/3 CBC good.   5/6  1st heparin level therapeutic on current rate of 1300 units/hr  No reported bleeding  2231 HL=0.24 below goal, no infusion or bleeding issues per  RN  Goal of Therapy:  Heparin level 0.3-0.7 units/ml Monitor platelets by anticoagulation protocol: Yes   Plan:  1) Increase heparin drip to 1500 units/hr 2) Will recheck heparin level with am labs 3) Daily heparin level and CBC    Lorenza EvangelistGreen, Merary Garguilo R 05/25/2016 11:04 PM

## 2016-05-25 NOTE — Progress Notes (Signed)
ANTICOAGULATION CONSULT NOTE - Initial Consult  Pharmacy Consult for IV heparin  Indication: chest pain/ACS  Allergies  Allergen Reactions  . Lisinopril Other (See Comments)    Cough    Patient Measurements: Height: 5' 7.5" (171.5 cm) Weight: (!) 341 lb 14.4 oz (155.1 kg) IBW/kg (Calculated) : 62.75 Heparin Dosing Weight: 102  Vital Signs: Temp: 98.4 F (36.9 C) (05/06 0528) Temp Source: Oral (05/06 0528) BP: 134/81 (05/06 0528) Pulse Rate: 74 (05/06 0528)  Labs:  Recent Labs  05/23/16 0634 05/24/16 [id number]  CREATININE 1.25* 1.16*    Estimated Creatinine Clearance: 85.2 mL/min (A) (by C-G formula based on SCr of 1.16 mg/dL (H)).   Medical History: Past Medical History:  Diagnosis Date  . Anxiety   . Arthritis   . Diabetes mellitus without complication (HCC)   . Hypertension     Medications:  Scheduled:  . aspirin  81 mg Oral Daily  . carvedilol  3.125 mg Oral BID WC  . famotidine  40 mg Oral BID  . FLUoxetine  40 mg Oral Daily  . furosemide  40 mg Oral BID  . gabapentin  600 mg Oral Daily  . gabapentin  900 mg Oral QHS  . insulin aspart protamine- aspart  35 Units Subcutaneous BID WC  . losartan  50 mg Oral Daily  . polyethylene glycol  17 g Oral Daily  . sodium chloride flush  3 mL Intravenous Q12H  . spironolactone  12.5 mg Oral Daily   Infusions:  . sodium chloride      Assessment: 56 yo female with new onset HF followed by cards as inpatient. Now to start IV heparin for new CP for possible ACS with plan for heart cath tomorrow 5/7. 5/3 CBC good.   Goal of Therapy:  Heparin level 0.3-0.7 units/ml Monitor platelets by anticoagulation protocol: Yes   Plan:  1) IV heparin 4000 unit bolus then  2) IV heparin infusion rate of 1300 units/hr 3) Check heparin level 6 hours after start of IV heparin 4) Daily heparin level and CBC   Hessie KnowsJustin M Vanity Larsson, PharmD, BCPS Pager [phone] 05/25/2016 9:42 AM

## 2016-05-25 NOTE — Progress Notes (Signed)
ANTICOAGULATION CONSULT NOTE - follow up  Pharmacy Consult for IV heparin  Indication: chest pain/ACS  Allergies  Allergen Reactions  . Lisinopril Other (See Comments)    Cough    Patient Measurements: Height: 5' 7.5" (171.5 cm) Weight: (!) 341 lb 14.4 oz (155.1 kg) IBW/kg (Calculated) : 62.75 Heparin Dosing Weight: 102  Vital Signs: Temp: 98.7 F (37.1 C) (05/06 1353) Temp Source: Oral (05/06 1353) BP: 133/82 (05/06 1353) Pulse Rate: 65 (05/06 1353)  Labs:  Recent Labs  05/23/16 0634 05/24/16 0611 05/25/16 1612  HEPARINUNFRC  --   --  0.37  CREATININE 1.25* 1.16*  --     Estimated Creatinine Clearance: 85.2 mL/min (A) (by C-G formula based on SCr of 1.16 mg/dL (H)).   Medical History: Past Medical History:  Diagnosis Date  . Anxiety   . Arthritis   . Diabetes mellitus without complication (HCC)   . Hypertension     Medications:  Scheduled:  . aspirin  81 mg Oral Daily  . carvedilol  3.125 mg Oral BID WC  . famotidine  40 mg Oral BID  . FLUoxetine  40 mg Oral Daily  . furosemide  40 mg Oral BID  . gabapentin  600 mg Oral Daily  . gabapentin  900 mg Oral QHS  . insulin aspart protamine- aspart  35 Units Subcutaneous BID WC  . losartan  50 mg Oral Daily  . polyethylene glycol  17 g Oral Daily  . sodium chloride flush  3 mL Intravenous Q12H  . spironolactone  12.5 mg Oral Daily   Infusions:  . sodium chloride    . heparin 1,300 Units/hr (05/25/16 1034)    Assessment: 56 yo female with new onset HF followed by cards as inpatient. Now to start IV heparin for new CP for possible ACS with plan for heart cath tomorrow 5/7. 5/3 CBC good.    1st heparin level therapeutic on current rate of 1300 units/hr  No reported bleeding  Goal of Therapy:  Heparin level 0.3-0.7 units/ml Monitor platelets by anticoagulation protocol: Yes   Plan:  1) Continue IV heparin at current rate of 1300 units/hr 2) Will recheck heparin level in 6 hours to confirm  continued goal level at current rate 3) Daily heparin level and CBC   Hessie KnowsJustin M Dianca Owensby, PharmD, BCPS Pager [phone] 05/25/2016 4:49 PM

## 2016-05-25 NOTE — Progress Notes (Signed)
*  Preliminary Results* Bilateral lower extremity venous duplex completed. There is no obvious evidence of acute deep vein thrombosis or Baker's cyst involving bilateral lower extremities.  05/25/2016 10:09 AM Gertie FeyMichelle Ellagrace Yoshida, BS, RVT, RDCS, RDMS

## 2016-05-25 NOTE — Progress Notes (Signed)
PROGRESS NOTE [facility]   Amber Melendez   ZOX:[id number] DOB: 09/11/1960  DOA: 05/19/2016 PCP: Elpidio EricLord, Richard, MD   Brief Narrative:  Patient is [age] mL-year-old female with past medical history of hypertension diabetes presented to the emergency department with lower extremity edema, orthopnea and generalized breath. She was admitted with findings of pulmonary edema and was found to have new diagnosis of CHF with low ejection fraction. Cardiology was consulted recommended nuclear stress tests which came high risk for CAD. Now recommending cardiac cath.   Subjective: Patient seen and examined, denies chest pain or shortness of breath. For cardiac cath in the morning. Patient concerned about hard mass on neck.  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 Strict I&O's, fluid restriction, monitor daily weights Nuclear stress test came high risk for ischemia, cardiology planning for cardiac cath on Monday. Patient high risk for CAD, smoker, obese, diabetes mellitus, hypertension and family history of early cardiac death.  GERD - in view of obesity and hiatal hernia - improved Continue current regimen: GI cocktail, Pepcid 40 mg BID Continue to monitor   Acute kidney injury -  Improving - likely to be cardiorenal syndrome as chest x-ray shows signs of pulmonary edema. Continues to improve with diuresis.  Continue Lasix 40 mg twice a day.  Essential hypertension- currently stable Patient on beta blocker, ARB's, Aldactone and Lasix Monitor BP  Tobacco abuse Cessation discussed Nicotine gum   Lower extremity edema right>left Doppler ultrasound ordered to rule out PE as patient have calf tenderness as well.  Diabetes mellitus type 2 - CBGs currently stable A1c 7.5 - 05/22/16 Continue current regimen Monitor CBGs.  DVT prophylaxis: Lovenox Code  Status: Full Family Communication: Family member at bedside Disposition Plan: Home when cardiology cleared  Consultants:   Cardiology  Procedures:   ECHO 05/20/16 ------------------------------------------------------------------- Study Conclusions  - Left ventricle: The cavity size was normal. Wall thickness was   normal. Systolic function was moderately to severely reduced. The   estimated ejection fraction was in the range of 30% to 35%.   Diffuse hypokinesis. Features are consistent with a pseudonormal   left ventricular filling pattern, with concomitant abnormal   relaxation and increased filling pressure (grade 2 diastolic   dysfunction). - Left atrium: The atrium was moderately dilated. - Right atrium: The atrium was mildly dilated.  Impressions:  - Moderate to severe global reduction in LV systolic function;   moderate diastolic dysfunction; biatrial enlargement.  Antimicrobials: Anti-infectives    None         Objective: Vitals:   05/24/16 1713 05/24/16 2110 05/25/16 0528 05/25/16 1353  BP: 132/71 139/66 134/81 133/82  Pulse: 60 90 74 65  Resp:  18 19 20   Temp:  98.6 F (37 C) 98.4 F (36.9 C) 98.7 F (37.1 C)  TempSrc:  Oral Oral Oral  SpO2:  99% 92% 94%  Weight:   (!) 155.1 kg (341 lb 14.4 oz)   Height:        Intake/Output Summary (Last 24 hours) at 05/25/16 1820 Last data filed at 05/25/16 1500  Gross per 24 hour  Intake          1377.63 ml  Output             1800 ml  Net          -422.37 ml   Filed Weights   05/23/16 0538 05/24/16  1610 05/25/16 0528  Weight: (!) 155.2 kg (342 lb 3.2 oz) (!) 154 kg (339 lb 8 oz) (!) 155.1 kg (341 lb 14.4 oz)    Examination:  General exam: NAD HEENT: No thyromegaly, acanthosis nigricans on the neck with skin thickening Respiratory system: Good breath sounds although distant, no crackles or rales noted Cardiovascular system: S1-S2 regular rate rhythm no JVD noted but difficult to appreciate due to  body habitus Gastrointestinal system: Abdomen super obese, no tenderness positive bowel movement Central nervous system: Alert and oriented Extremities: Extremity edema improving Skin: No lesions or rash Psychiatry: Mood depressed and sentimental.    Data Reviewed: I have personally reviewed following labs and imaging studies  CBC:  Recent Labs Lab 05/19/16 1855 05/22/16 0553  WBC 12.2* 10.4  NEUTROABS 9.0* 6.2  HGB 13.5 13.5  HCT 41.3 41.0  MCV 81.8 82.5  PLT 299 285   Basic Metabolic Panel:  Recent Labs Lab 05/20/16 0541 05/21/16 0528 05/22/16 0553 05/23/16 0634 05/24/16 0611  NA 139 138 138 137 137  K 3.8 3.8 3.5 3.9 3.9  CL 102 100* 99* 101 101  CO2 27 27 27 26 26   GLUCOSE 140* 145* 130* 127* 136*  BUN 15 18 20 17 14   CREATININE 1.09* 1.22* 1.33* 1.25* 1.16*  CALCIUM 9.4 9.3 9.0 9.0 9.1   GFR: Estimated Creatinine Clearance: 85.2 mL/min (A) (by C-G formula based on SCr of 1.16 mg/dL (H)). Liver Function Tests: No results for input(s): AST, ALT, ALKPHOS, BILITOT, PROT, ALBUMIN in the last 168 hours. No results for input(s): LIPASE, AMYLASE in the last 168 hours. No results for input(s): AMMONIA in the last 168 hours. Coagulation Profile: No results for input(s): INR, PROTIME in the last 168 hours. Cardiac Enzymes:  Recent Labs Lab 05/19/16 1855 05/20/16 1022  TROPONINI 0.03* <0.03   BNP (last 3 results) No results for input(s): PROBNP in the last 8760 hours. HbA1C: No results for input(s): HGBA1C in the last 72 hours. CBG:  Recent Labs Lab 05/24/16 1641 05/24/16 2124 05/25/16 0730 05/25/16 1143 05/25/16 1653  GLUCAP 147* 127* 128* 113* 119*   Lipid Profile: No results for input(s): CHOL, HDL, LDLCALC, TRIG, CHOLHDL, LDLDIRECT in the last 72 hours. Thyroid Function Tests:  Recent Labs  05/25/16 1612  TSH 2.721   Anemia Panel: No results for input(s): VITAMINB12, FOLATE, FERRITIN, TIBC, IRON, RETICCTPCT in the last 72 hours. Sepsis  Labs: No results for input(s): PROCALCITON, LATICACIDVEN in the last 168 hours.  No results found for this or any previous visit (from the past 240 hour(s)).       Radiology Studies: US Abdomen Complete  Result Date: 05/23/2016 CLINICAL DATA:  Abdominal pain. EXAM: ABDOMEN ULTRASOUND COMPLETE COMPARISON:  None. FINDINGS: Gallbladder: No gallstones or wall thickening visualized. No sonographic Murphy sign noted by sonographer. Common bile duct: Diameter: 3.7 mm Liver: Diffuse increased echogenicity with no suspicious masses. A 2.9 cm cyst is seen in the right hepatic lobe. IVC: No abnormality visualized. Pancreas: Visualized portion unremarkable. Spleen: Size and appearance within normal limits. Right Kidney: Length: 11.2 cm. Echogenicity within normal limits. No mass or hydronephrosis visualized. Left Kidney: Length: 11.5 cm. Echogenicity within normal limits. No mass or hydronephrosis visualized. Abdominal aorta: No aneurysm visualized. Other findings: None. IMPRESSION: 1. Probable hepatic steatosis. No other abnormalities identified. The study was limited by patient body habitus and shadowing bowel gas. Electronically Signed   By: Gerome Sam III M.D   On: 05/23/2016 21:34   Nm Myocar Multi  W/spect W/wall Motion / Ef  Addendum Date: 05/24/2016    There was no ST segment deviation noted during stress.  No T wave inversion was noted during stress.  There is a large defect of severe severity present in the basal anterior, basal anteroseptal, basal inferoseptal, basal inferior, basal inferolateral, basal anterolateral, mid anterior, mid anteroseptal, mid inferoseptal, mid inferior, mid inferolateral, mid anterolateral, apical anterior, apical inferior, apical lateral and apex location. The defect is reversible and consistent with ischemia. This is a very poor quality study and counts are diffusely low in all areas on stress images.  The left ventricular ejection fraction is mildly decreased  (45-54%).  Nuclear stress EF: 45%.  Unclear whether this represents true ischemia in all regions or is due to poor image quality in processing on stress imaging. In setting of LV dysfunction, recommend cardiac cath for further evaluation for CAD  Findings consistent with ischemia.  This is a high risk study.    Result Date: 05/24/2016  There was no ST segment deviation noted during stress.  No T wave inversion was noted during stress.  There is a large defect of severe severity present in the basal anterior, basal anteroseptal, basal inferoseptal, basal inferior, basal inferolateral, basal anterolateral, mid anterior, mid anteroseptal, mid inferoseptal, mid inferior, mid inferolateral, mid anterolateral, apical anterior, apical inferior, apical lateral and apex location. The defect is reversible and consistent with ischemia. This is a very poor quality study and counts are diffusely low in all areas on stress images.  The left ventricular ejection fraction is mildly decreased (45-54%).  Nuclear stress EF: 45%.  Unclear whether this represents true ischemia in all regions or is due to poor image quality in processing on stress imaging. In setting of LV dysfunction, recommend cardiac cath for further evaluation for CAD     Scheduled Meds: . aspirin  81 mg Oral Daily  . carvedilol  3.125 mg Oral BID WC  . famotidine  40 mg Oral BID  . FLUoxetine  40 mg Oral Daily  . furosemide  40 mg Oral BID  . gabapentin  600 mg Oral Daily  . gabapentin  900 mg Oral QHS  . insulin aspart protamine- aspart  35 Units Subcutaneous BID WC  . losartan  50 mg Oral Daily  . polyethylene glycol  17 g Oral Daily  . sodium chloride flush  3 mL Intravenous Q12H  . spironolactone  12.5 mg Oral Daily   Continuous Infusions: . sodium chloride    . heparin 1,300 Units/hr (05/25/16 1034)     LOS: 4 days    Latrelle Dodrill, MD Pager: Text Page via [email or web address]  [phone]  If 7PM-7AM, please contact  night-coverage [email or web address] Password [email or web address] 05/25/2016, 6:20 PM

## 2016-05-25 NOTE — Progress Notes (Addendum)
Progress Note  Patient Name: Amber Melendez Date of Encounter: 05/25/2016  Primary Cardiologist: Dr. Mayford Knifeurner  Subjective   Denies any chest pain or SOB  Inpatient Medications    Scheduled Meds: . carvedilol  3.125 mg Oral BID WC  . enoxaparin (LOVENOX) injection  0.5 mg/kg Subcutaneous Q24H  . famotidine  40 mg Oral BID  . FLUoxetine  40 mg Oral Daily  . furosemide  40 mg Oral BID  . gabapentin  600 mg Oral Daily  . gabapentin  900 mg Oral QHS  . insulin aspart protamine- aspart  35 Units Subcutaneous BID WC  . losartan  50 mg Oral Daily  . polyethylene glycol  17 g Oral Daily  . sodium chloride flush  3 mL Intravenous Q12H  . spironolactone  12.5 mg Oral Daily   Continuous Infusions: . sodium chloride     PRN Meds: sodium chloride, acetaminophen, albuterol, diphenhydrAMINE, gi cocktail, magic mouthwash w/lidocaine, menthol-cetylpyridinium, nicotine polacrilex, ondansetron (ZOFRAN) IV, oxycodone, sodium chloride flush   Vital Signs    Vitals:   05/24/16 1400 05/24/16 1713 05/24/16 2110 05/25/16 0528  BP: 135/79 132/71 139/66 134/81  Pulse: 77 60 90 74  Resp: 18  18 19   Temp: 98.5 F (36.9 C)  98.6 F (37 C) 98.4 F (36.9 C)  TempSrc: Oral  Oral Oral  SpO2: 97%  99% 92%  Weight:    (!) 341 lb 14.4 oz (155.1 kg)  Height:        Intake/Output Summary (Last 24 hours) at 05/25/16 0856 Last data filed at 05/25/16 0659  Gross per 24 hour  Intake              960 ml  Output             1000 ml  Net              -40 ml   Filed Weights   05/23/16 0538 05/24/16 0506 05/25/16 0528  Weight: (!) 342 lb 3.2 oz (155.2 kg) (!) 339 lb 8 oz (154 kg) (!) 341 lb 14.4 oz (155.1 kg)    Telemetry    NSR- Personally Reviewed  ECG    No new EKG - Personally Reviewed  Physical Exam   GEN: WD, WN in NAD Neck: No JVD or bruit Cardiac: RRR with no M/R/G.  Respiratory: CTA bilaterally GI: soft, NT, ND with active BS MS: no edema or deformity Neuro:  A&O x 3 Psych: normal  affect  Labs    Chemistry  Recent Labs Lab 05/22/16 0553 05/23/16 0634 05/24/16 0611  NA 138 137 137  K 3.5 3.9 3.9  CL 99* 101 101  CO2 27 26 26   GLUCOSE 130* 127* 136*  BUN 20 17 14   CREATININE 1.33* 1.25* 1.16*  CALCIUM 9.0 9.0 9.1  GFRNONAA 44* 47* 52*  GFRAA 51* 55* 60*  ANIONGAP 12 10 10      Hematology  Recent Labs Lab 05/19/16 1855 05/22/16 0553  WBC 12.2* 10.4  RBC 5.05 4.97  HGB 13.5 13.5  HCT 41.3 41.0  MCV 81.8 82.5  MCH 26.7 27.2  MCHC 32.7 32.9  RDW 16.5* 16.2*  PLT 299 285    Cardiac Enzymes  Recent Labs Lab 05/19/16 1855 05/20/16 1022  TROPONINI 0.03* <0.03   No results for input(s): TROPIPOC in the last 168 hours.   BNP  Recent Labs Lab 05/19/16 1855 05/23/16 0634  BNP 249.4* 120.3*     DDimer No results for input(s):  DDIMER in the last 168 hours.   Radiology    US Abdomen Complete  Result Date: 05/23/2016 CLINICAL DATA:  Abdominal pain. EXAM: ABDOMEN ULTRASOUND COMPLETE COMPARISON:  None. FINDINGS: Gallbladder: No gallstones or wall thickening visualized. No sonographic Murphy sign noted by sonographer. Common bile duct: Diameter: 3.7 mm Liver: Diffuse increased echogenicity with no suspicious masses. A 2.9 cm cyst is seen in the right hepatic lobe. IVC: No abnormality visualized. Pancreas: Visualized portion unremarkable. Spleen: Size and appearance within normal limits. Right Kidney: Length: 11.2 cm. Echogenicity within normal limits. No mass or hydronephrosis visualized. Left Kidney: Length: 11.5 cm. Echogenicity within normal limits. No mass or hydronephrosis visualized. Abdominal aorta: No aneurysm visualized. Other findings: None. IMPRESSION: 1. Probable hepatic steatosis. No other abnormalities identified. The study was limited by patient body habitus and shadowing bowel gas. Electronically Signed   By: Gerome Sam III M.D   On: 05/23/2016 21:34   Nm Myocar Multi W/spect Izetta Dakin Motion / Ef  Addendum Date: 05/24/2016     There was no ST segment deviation noted during stress.  No T wave inversion was noted during stress.  There is a large defect of severe severity present in the basal anterior, basal anteroseptal, basal inferoseptal, basal inferior, basal inferolateral, basal anterolateral, mid anterior, mid anteroseptal, mid inferoseptal, mid inferior, mid inferolateral, mid anterolateral, apical anterior, apical inferior, apical lateral and apex location. The defect is reversible and consistent with ischemia. This is a very poor quality study and counts are diffusely low in all areas on stress images.  The left ventricular ejection fraction is mildly decreased (45-54%).  Nuclear stress EF: 45%.  Unclear whether this represents true ischemia in all regions or is due to poor image quality in processing on stress imaging. In setting of LV dysfunction, recommend cardiac cath for further evaluation for CAD  Findings consistent with ischemia.  This is a high risk study.     Cardiac Studies   05/20/2016 2D echo Study Conclusions  - Left ventricle: The cavity size was normal. Wall thickness was   normal. Systolic function was moderately to severely reduced. The   estimated ejection fraction was in the range of 30% to 35%.   Diffuse hypokinesis. Features are consistent with a pseudonormal   left ventricular filling pattern, with concomitant abnormal   relaxation and increased filling pressure (grade 2 diastolic   dysfunction). - Left atrium: The atrium was moderately dilated. - Right atrium: The atrium was mildly dilated.  Impressions:  - Moderate to severe global reduction in LV systolic function;   moderate diastolic dysfunction; biatrial enlargement.  Patient Profile     56 y.o. female  with a PMH significant for HTN, DM, arthritis, and anxiety.  She has no cardiac history but has a family history of premature CAD with her sister dying in her 33's of an MI.  She also smokes 1/2 ppd of cigarettes.  She  presented to the ER with complaints of LE edema, orthopnea and DOE and CXRAY showed pulmonary edema.  She also complained of chest discomfort that she describes as a cramp in her epigastric region and atypical in that it improves with massage of the area.  2D echo showed moderately reduced LVF with EF 30-35% with diffuse HK and moderate BAE.  Trop is neg x 2 and BNP elevated at 249.  She tells me that she had a severe viral respiratory infection a month ago and is still recovering from it so this   Assessment & Plan  1. 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 Viral CM as she had a severe viral respiratory infection about a month ago vs. CAD - BNP 249.4 on admission - troponin x 2 negative (0.03 --> <0.03) - EKG with NSR with frequent ectopy - overall net negative 1.5L with 1L urine output yesterday  - weight on admission was 338 lbs, today she is 341 lbs - dry weight not yet determined - poorly controlled HTN likely contributing - She does not appear markedly volume overloaded today.  There is no LE edema and no appreciable JVD although difficult to assess due to obesity.  Can assess LVEDP at cath tomorrow.   - continue Lasix to 40mg  BID PO  2.  Uncontrolled hypertension 159/105 on day of admission - BP has improved and 134/27mmHg today - may be due to noncompliance with meds and sodium - continue ARB, Carvedilol and aldactone.  3.  Chest pain - She reports significant epigastric pain that feels like a cramping pain or pressure. She also described pain in her lower chest that felt like someone was sitting on her chest. She does not describe typical anginal symptoms, and it is unclear if these symptoms are related to underlying poorly controlled HTN and CHF or underlying CAD. However, in an obese patient with diabetes, anginal symptoms may vary.  - start ASA 81mg  daily - 2 day lexiscan myoview markedly abnormal with reduced counts throughout compared to  resting images.  Unclear whether this is related to processing or actually represents ischemia.  Considered a high risk scan in setting of LV dysfunction.   - recommend cardiac cath tomorrow - Cardiac catheterization was discussed with the patient fully. The patient understands that risks include but are not limited to stroke (1 in 1000), death (1 in 1000), kidney failure [usually temporary] (1 in 500), bleeding (1 in 200), allergic reaction [possibly serious] (1 in 200).  The patient understands and is willing to proceed.   - change Lovenox for DVT to IV Heparin gtt  4. DM - per primary team - A1c 7.5 - per IM  5. AKI - sCr 1.16 (1.09 admit) - monitor kidney function on PO diuretics - if sCr continues to trend up any further, will back off on lasix   Signed, Armanda Magic, MD  05/25/2016, 8:56 AM

## 2016-05-26 ENCOUNTER — Inpatient Hospital Stay (HOSPITAL_COMMUNITY): Payer: Medicare Other

## 2016-05-26 ENCOUNTER — Other Ambulatory Visit: Payer: Self-pay

## 2016-05-26 LAB — BASIC METABOLIC PANEL
ANION GAP: 9 (ref 5–15)
BUN: 11 mg/dL (ref 6–20)
CO2: 27 mmol/L (ref 22–32)
Calcium: 9 mg/dL (ref 8.9–10.3)
Chloride: 99 mmol/L — ABNORMAL LOW (ref 101–111)
Creatinine, Ser: 1.09 mg/dL — ABNORMAL HIGH (ref 0.44–1.00)
GFR calc non Af Amer: 56 mL/min — ABNORMAL LOW (ref >60)
GLUCOSE: 134 mg/dL — AB (ref 65–99)
POTASSIUM: 3.7 mmol/L (ref 3.5–5.1)
Sodium: 135 mmol/L (ref 135–145)

## 2016-05-26 LAB — CBC
HCT: 41.8 % (ref 36.0–46.0)
HEMOGLOBIN: 13.6 g/dL (ref 12.0–15.0)
MCH: 27.1 pg (ref 26.0–34.0)
MCHC: 32.5 g/dL (ref 30.0–36.0)
MCV: 83.3 fL (ref 78.0–100.0)
Platelets: 261 10*3/uL (ref 150–400)
RBC: 5.02 MIL/uL (ref 3.87–5.11)
RDW: 16 % — ABNORMAL HIGH (ref 11.5–15.5)
WBC: 9.9 10*3/uL (ref 4.0–10.5)

## 2016-05-26 LAB — GLUCOSE, CAPILLARY
GLUCOSE-CAPILLARY: 115 mg/dL — AB (ref 65–99)
GLUCOSE-CAPILLARY: 148 mg/dL — AB (ref 65–99)
Glucose-Capillary: 123 mg/dL — ABNORMAL HIGH (ref 65–99)
Glucose-Capillary: 109 mg/dL — ABNORMAL HIGH (ref 65–99)

## 2016-05-26 LAB — HEPARIN LEVEL (UNFRACTIONATED)
HEPARIN UNFRACTIONATED: 0.55 [IU]/mL (ref 0.30–0.70)
Heparin Unfractionated: 0.2 IU/mL — ABNORMAL LOW (ref 0.30–0.70)
Heparin Unfractionated: 0.44 IU/mL (ref 0.30–0.70)

## 2016-05-26 MED ORDER — SPIRONOLACTONE 25 MG PO TABS
25.0000 mg | ORAL_TABLET | Freq: Every day | ORAL | Status: DC
  Administered 2016-05-27 – 2016-05-30 (×4): 25 mg via ORAL
  Filled 2016-05-26 (×4): qty 1

## 2016-05-26 MED ORDER — IPRATROPIUM-ALBUTEROL 0.5-2.5 (3) MG/3ML IN SOLN
3.0000 mL | RESPIRATORY_TRACT | Status: DC
  Administered 2016-05-26 – 2016-05-29 (×15): 3 mL via RESPIRATORY_TRACT
  Filled 2016-05-26 (×15): qty 3

## 2016-05-26 MED ORDER — FUROSEMIDE 10 MG/ML IJ SOLN
40.0000 mg | Freq: Two times a day (BID) | INTRAMUSCULAR | Status: DC
  Administered 2016-05-26 – 2016-05-28 (×4): 40 mg via INTRAVENOUS
  Filled 2016-05-26 (×4): qty 4

## 2016-05-26 MED ORDER — HEPARIN (PORCINE) IN NACL 100-0.45 UNIT/ML-% IJ SOLN
1800.0000 [IU]/h | INTRAMUSCULAR | Status: DC
  Administered 2016-05-26 – 2016-05-27 (×2): 1800 [IU]/h via INTRAVENOUS
  Filled 2016-05-26 (×2): qty 250

## 2016-05-26 MED ORDER — CARVEDILOL 3.125 MG PO TABS
6.2500 mg | ORAL_TABLET | Freq: Two times a day (BID) | ORAL | Status: DC
  Administered 2016-05-26 – 2016-05-29 (×6): 6.25 mg via ORAL
  Filled 2016-05-26: qty 2
  Filled 2016-05-26 (×2): qty 1
  Filled 2016-05-26 (×3): qty 2

## 2016-05-26 NOTE — Progress Notes (Signed)
ANTICOAGULATION CONSULT NOTE - follow up  Pharmacy Consult for IV heparin  Indication: chest pain/ACS  Allergies  Allergen Reactions  . Lisinopril Other (See Comments)    Cough    Patient Measurements: Height: 5' 7.5" (171.5 cm) Weight: (!) 342 lb 1.6 oz (155.2 kg) IBW/kg (Calculated) : 62.75 Heparin Dosing Weight: 102 kg  Vital Signs: Temp: 98.4 F (36.9 C) (05/07 1405) Temp Source: Oral (05/07 1405) BP: 149/67 (05/07 1405) Pulse Rate: 54 (05/07 1405)  Labs:  Recent Labs  05/24/16 [id number] 05/25/16 1612 05/25/16 2231 05/26/16 0524 05/26/16 0529  HGB  --   --   --   --  13.6  HCT  --   --   --   --  41.8  PLT  --   --   --   --  261  HEPARINUNFRC  --  0.37 0.24*  --  0.20*  CREATININE 1.16*  --   --  1.09*  --     Estimated Creatinine Clearance: 90.8 mL/min (A) (by C-G formula based on SCr of 1.09 mg/dL (H)).   Medical History: Past Medical History:  Diagnosis Date  . Anxiety   . Arthritis   . Diabetes mellitus without complication (HCC)   . Hypertension     Medications:  Scheduled:  . aspirin  81 mg Oral Daily  . carvedilol  6.25 mg Oral BID WC  . famotidine  40 mg Oral BID  . FLUoxetine  40 mg Oral Daily  . furosemide  40 mg Intravenous BID  . gabapentin  600 mg Oral Daily  . gabapentin  900 mg Oral QHS  . insulin aspart protamine- aspart  35 Units Subcutaneous BID WC  . ipratropium-albuterol  3 mL Nebulization Q4H  . losartan  50 mg Oral Daily  . polyethylene glycol  17 g Oral Daily  . sodium chloride flush  3 mL Intravenous Q12H  . sodium chloride flush  3 mL Intravenous Q12H  . [START ON 05/27/2016] spironolactone  25 mg Oral Daily   Infusions:  . sodium chloride    . sodium chloride    . sodium chloride    . heparin      Assessment: 56 yo female with new onset HF followed by cards as inpatient. Now to start IV heparin for new CP for possible ACS.  Today, 05/26/2016: - heparin level now back therapeutic at 0.44 - cbc stable - no bleeding  documented - Cardiac cath postponed until Mon 5/8 (unable to fit patient in on 5/7)   Goal of Therapy:  Heparin level 0.3-0.7 units/ml Monitor platelets by anticoagulation protocol: Yes   Plan:  - continue heparin drip at 1800 units/hr - recheck  heparin level in 6 hours to confirm before changing to daily monitoring - monitor for s/s bleeding  Lucia Gaskinsham, Jenah Vanasten P 05/26/2016 2:29 PM

## 2016-05-26 NOTE — Progress Notes (Addendum)
Progress Note  Patient Name: Amber Melendez Date of Encounter: 05/26/2016  Primary Cardiologist: Dr. Mayford Knifeurner  Subjective   She feels SOB, no chest pain.  Inpatient Medications    Scheduled Meds: . aspirin  81 mg Oral Daily  . carvedilol  3.125 mg Oral BID WC  . famotidine  40 mg Oral BID  . FLUoxetine  40 mg Oral Daily  . furosemide  40 mg Oral BID  . gabapentin  600 mg Oral Daily  . gabapentin  900 mg Oral QHS  . insulin aspart protamine- aspart  35 Units Subcutaneous BID WC  . losartan  50 mg Oral Daily  . polyethylene glycol  17 g Oral Daily  . sodium chloride flush  3 mL Intravenous Q12H  . sodium chloride flush  3 mL Intravenous Q12H  . spironolactone  12.5 mg Oral Daily   Continuous Infusions: . sodium chloride    . sodium chloride    . sodium chloride    . heparin     PRN Meds: sodium chloride, sodium chloride, acetaminophen, albuterol, diphenhydrAMINE, gi cocktail, magic mouthwash w/lidocaine, menthol-cetylpyridinium, nicotine polacrilex, ondansetron (ZOFRAN) IV, oxycodone, sodium chloride flush, sodium chloride flush   Vital Signs    Vitals:   05/25/16 0528 05/25/16 1353 05/25/16 2132 05/26/16 0530  BP: 134/81 133/82 132/68 130/65  Pulse: 74 65 63 65  Resp: 19 20 20 20   Temp: 98.4 F (36.9 C) 98.7 F (37.1 C) 98 F (36.7 C) 98.6 F (37 C)  TempSrc: Oral Oral Oral Oral  SpO2: 92% 94% 100% 100%  Weight: (!) 341 lb 14.4 oz (155.1 kg)   (!) 342 lb 1.6 oz (155.2 kg)  Height:        Intake/Output Summary (Last 24 hours) at 05/26/16 1324 Last data filed at 05/26/16 0530  Gross per 24 hour  Intake           297.63 ml  Output              700 ml  Net          -402.37 ml   Filed Weights   05/24/16 0506 05/25/16 0528 05/26/16 0530  Weight: (!) 339 lb 8 oz (154 kg) (!) 341 lb 14.4 oz (155.1 kg) (!) 342 lb 1.6 oz (155.2 kg)    Telemetry    NSR- Personally Reviewed  ECG    No new EKG - Personally Reviewed  Physical Exam   GEN: WD, WN in NAD Neck:  No JVD or bruit Cardiac: RRR with no M/R/G.  Respiratory: wheezing B/L GI: soft, NT, ND with active BS MS: no edema or deformity Neuro:  A&O x 3 Psych: normal affect  Labs    Chemistry  Recent Labs Lab 05/23/16 0634 05/24/16 0611 05/26/16 0524  NA 137 137 135  K 3.9 3.9 3.7  CL 101 101 99*  CO2 26 26 27   GLUCOSE 127* 136* 134*  BUN 17 14 11   CREATININE 1.25* 1.16* 1.09*  CALCIUM 9.0 9.1 9.0  GFRNONAA 47* 52* 56*  GFRAA 55* 60* >60  ANIONGAP 10 10 9      Hematology  Recent Labs Lab 05/19/16 1855 05/22/16 0553 05/26/16 0529  WBC 12.2* 10.4 9.9  RBC 5.05 4.97 5.02  HGB 13.5 13.5 13.6  HCT 41.3 41.0 41.8  MCV 81.8 82.5 83.3  MCH 26.7 27.2 27.1  MCHC 32.7 32.9 32.5  RDW 16.5* 16.2* 16.0*  PLT 299 285 261    Cardiac Enzymes  Recent Labs Lab  05/19/16 1855 05/20/16 1022  TROPONINI 0.03* <0.03   No results for input(s): TROPIPOC in the last 168 hours.   BNP  Recent Labs Lab 05/19/16 1855 05/23/16 0634  BNP 249.4* 120.3*     DDimer No results for input(s): DDIMER in the last 168 hours.   Radiology    US Abdomen Complete  Result Date: 05/23/2016 CLINICAL DATA:  Abdominal pain. EXAM: ABDOMEN ULTRASOUND COMPLETE COMPARISON:  None. FINDINGS: Gallbladder: No gallstones or wall thickening visualized. No sonographic Murphy sign noted by sonographer. Common bile duct: Diameter: 3.7 mm Liver: Diffuse increased echogenicity with no suspicious masses. A 2.9 cm cyst is seen in the right hepatic lobe. IVC: No abnormality visualized. Pancreas: Visualized portion unremarkable. Spleen: Size and appearance within normal limits. Right Kidney: Length: 11.2 cm. Echogenicity within normal limits. No mass or hydronephrosis visualized. Left Kidney: Length: 11.5 cm. Echogenicity within normal limits. No mass or hydronephrosis visualized. Abdominal aorta: No aneurysm visualized. Other findings: None. IMPRESSION: 1. Probable hepatic steatosis. No other abnormalities identified. The  study was limited by patient body habitus and shadowing bowel gas. Electronically Signed   By: Gerome Sam III M.D   On: 05/23/2016 21:34   Nm Myocar Multi W/spect Izetta Dakin Motion / Ef  Addendum Date: 05/24/2016    There was no ST segment deviation noted during stress.  No T wave inversion was noted during stress.  There is a large defect of severe severity present in the basal anterior, basal anteroseptal, basal inferoseptal, basal inferior, basal inferolateral, basal anterolateral, mid anterior, mid anteroseptal, mid inferoseptal, mid inferior, mid inferolateral, mid anterolateral, apical anterior, apical inferior, apical lateral and apex location. The defect is reversible and consistent with ischemia. This is a very poor quality study and counts are diffusely low in all areas on stress images.  The left ventricular ejection fraction is mildly decreased (45-54%).  Nuclear stress EF: 45%.  Unclear whether this represents true ischemia in all regions or is due to poor image quality in processing on stress imaging. In setting of LV dysfunction, recommend cardiac cath for further evaluation for CAD  Findings consistent with ischemia.  This is a high risk study.     Cardiac Studies   05/20/2016 2D echo Study Conclusions  - Left ventricle: The cavity size was normal. Wall thickness was   normal. Systolic function was moderately to severely reduced. The   estimated ejection fraction was in the range of 30% to 35%.   Diffuse hypokinesis. Features are consistent with a pseudonormal   left ventricular filling pattern, with concomitant abnormal   relaxation and increased filling pressure (grade 2 diastolic   dysfunction). - Left atrium: The atrium was moderately dilated. - Right atrium: The atrium was mildly dilated.  Impressions:  - Moderate to severe global reduction in LV systolic function;   moderate diastolic dysfunction; biatrial enlargement.  Telemetry: SR, nsVT up to 10 beats,  polymorphic, possibly ischemic    Patient Profile     56 y.o. female  with a PMH significant for HTN, DM, arthritis, and anxiety.  She has no cardiac history but has a family history of premature CAD with her sister dying in her 63's of an MI.  She also smokes 1/2 ppd of cigarettes.  She presented to the ER with complaints of LE edema, orthopnea and DOE and CXRAY showed pulmonary edema.  She also complained of chest discomfort that she describes as a cramp in her epigastric region and atypical in that it improves with massage of  the area.  2D echo showed moderately reduced LVF with EF 30-35% with diffuse HK and moderate BAE.  Trop is neg x 2 and BNP elevated at 249.  She tells me that she had a severe viral respiratory infection a month ago and is still recovering from it so this   Assessment & Plan    1. 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 Viral CM as she had a severe viral respiratory infection about a month ago vs. CAD - BNP 249.4 on admission - troponin x 2 negative (0.03 --> <0.03) - EKG with NSR with frequent ectopy - overall net negative 2L , no improvement in SOB yet  - weight on admission was 338 lbs, today she is 342 lbs - dry weight not yet determined - poorly controlled HTN likely contributing - She does not appear markedly volume overloaded today.  There is no LE edema and no appreciable JVD although difficult to assess due to obesity.  Can assess LVEDP at cath tomorrow.   - increase Lasix to 40mg  IV BID  2.  Uncontrolled hypertension - now controlled - may be due to noncompliance with meds and sodium - continue ARB, Carvedilol and aldactone.  3.  Chest pain -  - atypical  - start ASA 81mg  daily - 2 day lexiscan myoview markedly abnormal with reduced counts throughout compared to resting images.  Unclear whether this is related to processing or actually represents ischemia.  Considered a high risk scan in setting of LV dysfunction.    - recommend cardiac cath tomorrow - Cardiac catheterization scheduled for tomorrow - continue IV Heparin gtt  4. nsVT - up to 10 beats, incresae carvedilol to 6.25 mg po BID  5. AKI - sCr 1.16 (1.09 admit) - monitor kidney function on PO diuretics - if sCr continues to trend up any further, will back off on lasix   Signed, Tobias Alexander, MD  05/26/2016, 1:24 PM

## 2016-05-26 NOTE — Progress Notes (Signed)
PROGRESS NOTE [facility]   Amber Melendez   GNF:[id number] DOB: [birth year]  DOA: 05/19/2016 PCP: Elpidio Eric, MD   Brief Narrative:  Patient is 53 mL-year-old female with past medical history of hypertension diabetes presented to the emergency department with lower extremity edema, orthopnea and generalized breath. She was admitted with findings of pulmonary edema and was found to have new diagnosis of CHF with low ejection fraction. Cardiology was consulted recommended nuclear stress tests which came high risk for CAD. Now recommending cardiac cath.   Subjective: Patient seen and examined, feel better this AM. Awaiting for cardiac cath rescheduled for tomorrow   Assessment & Plan: Acute combining systolic and diastolic congestive heart failure BB, ARB, Aldactone and Lasix. Strict I&O's, fluid restriction, monitor daily weights Nuclear stress test came high risk for ischemia - Plan for cardiac cath tomorrow  Patient high risk for CAD, smoker, obese, diabetes mellitus, hypertension and family history of early cardiac death. Had 10 beats of nsVT - Coreg increased to 6.25 mg  Continue current regimen   ? Enlarge Thyroid  TSH normal  Thyroid US ordered   GERD - in view of obesity and hiatal hernia - improved Continue current regimen: GI cocktail, Pepcid 40 mg BID Continue to monitor   Acute kidney injury -  Improving - likely to be cardiorenal syndrome as chest x-ray shows signs of pulmonary edema. Cr Improved with diuresis. Monitor PRN   Essential hypertension- currently stable Patient on beta blocker, ARB's, Aldactone and Lasix Monitor BP  Tobacco abuse Cessation discussed Nicotine gum   Lower extremity edema right>left No DVT   Diabetes mellitus type 2 - CBGs currently stable A1c 7.5 - 05/22/16 Continue current regimen Monitor CBGs.  DVT prophylaxis: Lovenox Code Status: Full Family Communication: Family member at bedside Disposition Plan: Home when  cardiology cleared  Consultants:   Cardiology  Procedures:   ECHO 05/20/16 ------------------------------------------------------------------- Study Conclusions  - Left ventricle: The cavity size was normal. Wall thickness was   normal. Systolic function was moderately to severely reduced. The   estimated ejection fraction was in the range of 30% to 35%.   Diffuse hypokinesis. Features are consistent with a pseudonormal   left ventricular filling pattern, with concomitant abnormal   relaxation and increased filling pressure (grade 2 diastolic   dysfunction). - Left atrium: The atrium was moderately dilated. - Right atrium: The atrium was mildly dilated.  Impressions:  - Moderate to severe global reduction in LV systolic function;   moderate diastolic dysfunction; biatrial enlargement.  Antimicrobials: Anti-infectives    None         Objective: Vitals:   05/25/16 0528 05/25/16 1353 05/25/16 2132 05/26/16 0530  BP: 134/81 133/82 132/68 130/65  Pulse: 74 65 63 65  Resp: 19 20 20 20   Temp: 98.4 F (36.9 C) 98.7 F (37.1 C) 98 F (36.7 C) 98.6 F (37 C)  TempSrc: Oral Oral Oral Oral  SpO2: 92% 94% 100% 100%  Weight: (!) 155.1 kg (341 lb 14.4 oz)   (!) 155.2 kg (342 lb 1.6 oz)  Height:        Intake/Output Summary (Last 24 hours) at 05/26/16 1326 Last data filed at 05/26/16 0530  Gross per 24 hour  Intake           297.63 ml  Output              700 ml  Net          -402.37 ml   [employer]  05/24/16 0506 05/25/16 0528 05/26/16 0530  Weight: (!) 154 kg (339 lb 8 oz) (!) 155.1 kg (341 lb 14.4 oz) (!) 155.2 kg (342 lb 1.6 oz)    Examination:  General exam: NAD HEENT: Neck supple, thyroid ? Enlargement, non tender. acanthosis nigra and skin thickening  Respiratory system: Good breath sounds although distant, no crackles or rales noted Cardiovascular system: S1S2 RRR no murmurs  Extremities: Trace LE edema  Psychiatry: Mood appropriate   Data  Reviewed: I have personally reviewed following labs and imaging studies  CBC:  Recent Labs Lab 05/19/16 1855 05/22/16 0553 05/26/16 0529  WBC 12.2* 10.4 9.9  NEUTROABS 9.0* 6.2  --   HGB 13.5 13.5 13.6  HCT 41.3 41.0 41.8  MCV 81.8 82.5 83.3  PLT 299 285 261   Basic Metabolic Panel:  Recent Labs Lab 05/21/16 0528 05/22/16 0553 05/23/16 0634 05/24/16 0611 05/26/16 0524  NA 138 138 137 137 135  K 3.8 3.5 3.9 3.9 3.7  CL 100* 99* 101 101 99*  CO2 27 27 26 26 27   GLUCOSE 145* 130* 127* 136* 134*  BUN 18 20 17 14 11   CREATININE 1.22* 1.33* 1.25* 1.16* 1.09*  CALCIUM 9.3 9.0 9.0 9.1 9.0   GFR: Estimated Creatinine Clearance: 90.8 mL/min (A) (by C-G formula based on SCr of 1.09 mg/dL (H)). Liver Function Tests: No results for input(s): AST, ALT, ALKPHOS, BILITOT, PROT, ALBUMIN in the last 168 hours. No results for input(s): LIPASE, AMYLASE in the last 168 hours. No results for input(s): AMMONIA in the last 168 hours. Coagulation Profile: No results for input(s): INR, PROTIME in the last 168 hours. Cardiac Enzymes:  Recent Labs Lab 05/19/16 1855 05/20/16 1022  TROPONINI 0.03* <0.03   BNP (last 3 results) No results for input(s): PROBNP in the last 8760 hours. HbA1C: No results for input(s): HGBA1C in the last 72 hours. CBG:  Recent Labs Lab 05/25/16 1143 05/25/16 1653 05/25/16 2125 05/26/16 0729 05/26/16 1147  GLUCAP 113* 119* 140* 123* 109*   Lipid Profile: No results for input(s): CHOL, HDL, LDLCALC, TRIG, CHOLHDL, LDLDIRECT in the last 72 hours. Thyroid Function Tests:  Recent Labs  05/25/16 1612  TSH 2.721    No results found for this or any previous visit (from the past 240 hour(s)).    Radiology Studies: US Thyroid  Result Date: 05/26/2016 CLINICAL DATA:  Thyroid enlargement on exam EXAM: THYROID ULTRASOUND TECHNIQUE: Ultrasound examination of the thyroid gland and adjacent soft tissues was performed. COMPARISON:  None available FINDINGS:  Parenchymal Echotexture: Mildly heterogenous Isthmus: 4 mm Right lobe: 4.2 x 2.0 x 2.4 cm Left lobe: 4.6 x 2.0 x 2.1 cm _________________________________________________________ Estimated total number of nodules >/= 1 cm: 1 Number of spongiform nodules >/=  2 cm not described below (TR1): 0 Number of mixed cystic and solid nodules >/= 1.5 cm not described below (TR2): 0 _________________________________________________________ Nodule # 1: Location: Right; Inferior Maximum size: 1.0 cm; Other 2 dimensions: 0.6 x 0.9 cm Composition: solid/almost completely solid (2) Echogenicity: isoechoic (1) Shape: not taller-than-wide (0) Margins: ill-defined (0) Echogenic foci: none (0) ACR TI-RADS total points: 3. ACR TI-RADS risk category: TR3 (3 points). ACR TI-RADS recommendations: Given size (<1.4 cm) and appearance, this nodule does NOT meet TI-RADS criteria for biopsy or dedicated follow-up. _________________________________________________________ Nodule # 2: Location: Left; Mid Maximum size: 0.9 cm; Other 2 dimensions: 0.5 x 0.7 cm Composition: solid/almost completely solid (2) Echogenicity: isoechoic (1) Shape: not taller-than-wide (0) Margins: ill-defined (0) Echogenic foci: none (  0) ACR TI-RADS total points: 3. ACR TI-RADS risk category: TR3 (3 points). ACR TI-RADS recommendations: Given size (<1.4 cm) and appearance, this nodule does NOT meet TI-RADS criteria for biopsy or dedicated follow-up. _________________________________________________________ IMPRESSION: Small bilateral solid isoechoic TR 3 nodules, as detailed above. These do not meet criteria for biopsy or follow-up. The above is in keeping with the ACR TI-RADS recommendations - J Am Coll Radiol 2017;14:587-595. Electronically Signed   By: Judie PetitM.  Shick M.D.   On: 05/26/2016 12:29    Scheduled Meds: . aspirin  81 mg Oral Daily  . carvedilol  3.125 mg Oral BID WC  . famotidine  40 mg Oral BID  . FLUoxetine  40 mg Oral Daily  . furosemide  40 mg Oral BID    . gabapentin  600 mg Oral Daily  . gabapentin  900 mg Oral QHS  . insulin aspart protamine- aspart  35 Units Subcutaneous BID WC  . losartan  50 mg Oral Daily  . polyethylene glycol  17 g Oral Daily  . sodium chloride flush  3 mL Intravenous Q12H  . sodium chloride flush  3 mL Intravenous Q12H  . spironolactone  12.5 mg Oral Daily   Continuous Infusions: . sodium chloride    . sodium chloride    . sodium chloride    . heparin       LOS: 5 days    Latrelle DodrillEdwin Silva, MD Pager: Text Page via [email or web address]  [phone]  If 7PM-7AM, please contact night-coverage [email or web address] Password [email or web address] 05/26/2016, 1:26 PM

## 2016-05-26 NOTE — Progress Notes (Signed)
Chaplain stopped in while rounding the floor.  Patient is waiting to [facility].  Patient is not sure when she is leaving because she thought she would have been gone by now.  Patient has had conversation with sister about Advanced Directive and would like to have the paperwork.  Chaplain left paperwork with patient and will check back with her.  Patient states that [facility] likely keep [facility] there.    Chaplain encouraged patient in her road to recovery.  Provided ministry of presence and listening.    05/26/16 1040  Clinical Encounter Type  Visited With Patient;Family  Visit Type Follow-up;Psychological support;Spiritual support  Stress Factors  Patient Stress Factors Health changes;Exhausted

## 2016-05-26 NOTE — Care Management Important Message (Signed)
Important Message  Patient Details  Name: Amber Melendez MRN: [id number] Date of Birth: 09/15/1960   Medicare Important Message Given:  Yes    Caren MacadamFuller, Daivik Overley 05/26/2016, 11:22 AMImportant Message  Patient Details  Name: Amber Melendez MRN: [id number] Date of Birth: 05/29/1960   Medicare Important Message Given:  Yes    Caren MacadamFuller, Rosali Augello 05/26/2016, 11:22 AM

## 2016-05-26 NOTE — Progress Notes (Signed)
ANTICOAGULATION CONSULT NOTE - follow up  Pharmacy Consult for IV heparin  Indication: chest pain/ACS  Allergies  Allergen Reactions  . Lisinopril Other (See Comments)    Cough    Patient Measurements: Height: 5' 7.5" (171.5 cm) Weight: (!) 341 lb 14.4 oz (155.1 kg) IBW/kg (Calculated) : 62.75 Heparin Dosing Weight: 102  Vital Signs: Temp: 98 F (36.7 C) (05/06 2132) Temp Source: Oral (05/06 2132) BP: 132/68 (05/06 2132) Pulse Rate: 63 (05/06 2132)  Labs:  Recent Labs  05/23/16 [id number] 05/24/16 [id number] 05/25/16 1612 05/25/16 2231 05/26/16 0529  HGB  --   --   --   --  13.6  HCT  --   --   --   --  41.8  PLT  --   --   --   --  261  HEPARINUNFRC  --   --  0.37 0.24* 0.20*  CREATININE 1.25* 1.16*  --   --   --     Estimated Creatinine Clearance: 85.2 mL/min (A) (by C-G formula based on SCr of 1.16 mg/dL (H)).   Medical History: Past Medical History:  Diagnosis Date  . Anxiety   . Arthritis   . Diabetes mellitus without complication (HCC)   . Hypertension     Medications:  Scheduled:  . aspirin  81 mg Oral Daily  . carvedilol  3.125 mg Oral BID WC  . famotidine  40 mg Oral BID  . FLUoxetine  40 mg Oral Daily  . furosemide  40 mg Oral BID  . gabapentin  600 mg Oral Daily  . gabapentin  900 mg Oral QHS  . insulin aspart protamine- aspart  35 Units Subcutaneous BID WC  . losartan  50 mg Oral Daily  . polyethylene glycol  17 g Oral Daily  . sodium chloride flush  3 mL Intravenous Q12H  . sodium chloride flush  3 mL Intravenous Q12H  . spironolactone  12.5 mg Oral Daily   Infusions:  . sodium chloride    . sodium chloride    . sodium chloride    . heparin      Assessment: 56 yo female with new onset HF followed by cards as inpatient. Now to start IV heparin for new CP for possible ACS with plan for heart cath tomorrow 5/7. 5/3 CBC good.   5/6  1st heparin level therapeutic on current rate of 1300 units/hr  No reported bleeding  2231 HL=0.24 below  goal, no infusion or bleeding issues per RN Today, 5/7  0529 HL=0.20 , no infusion or bleeding issues per RN- pt off heparin drip right now b/c in shower but heparin was running when lab drawn.   Goal of Therapy:  Heparin level 0.3-0.7 units/ml Monitor platelets by anticoagulation protocol: Yes   Plan:  1) Increase heparin drip to 1800 units/hr 2) Will recheck heparin level in 6 hours 3) Daily heparin level and CBC    Lorenza EvangelistGreen, Yanissa Michalsky R 05/26/2016 6:19 AM

## 2016-05-26 NOTE — Care Management Note (Signed)
Case Management Note  Patient Details  Name: Corky Singris Pella MRN: [id number] Date of Birth: 06/07/1960  Subjective/Objective:  [facility]. D/c plan home-otpt PT(already has script), dme [facility]-already following to deliver 3n1,rw to home.                   Action/Plan:d/c plan home.   Expected Discharge Date:                  Expected Discharge Plan:  OP Rehab  In-House Referral:     Discharge planning Services  CM Consult  Post Acute Care Choice:    Choice offered to:     DME Arranged:  3-N-1, Walker rolling with seat DME Agency:  [facility]  HH Arranged:    HH Agency:     Status of Service:  In process, will continue to follow  If discussed at Long Length of Stay Meetings, dates discussed:    Additional Comments:  Lanier ClamMahabir, Lysha Schrade, RN 05/26/2016, 12:50 PM

## 2016-05-27 ENCOUNTER — Other Ambulatory Visit: Payer: Self-pay

## 2016-05-27 ENCOUNTER — Encounter (HOSPITAL_COMMUNITY)
Admission: EM | Disposition: A | Discharge status: Home / Self Care | Payer: Self-pay | Source: Home / Self Care | Attending: Family Medicine

## 2016-05-27 DIAGNOSIS — K047 Periapical abscess without sinus: Secondary | ICD-10-CM

## 2016-05-27 DIAGNOSIS — R7989 Other specified abnormal findings of blood chemistry: Secondary | ICD-10-CM

## 2016-05-27 DIAGNOSIS — I472 Ventricular tachycardia: Secondary | ICD-10-CM

## 2016-05-27 HISTORY — PX: LEFT HEART CATH AND CORONARY ANGIOGRAPHY: CATH118249

## 2016-05-27 LAB — GLUCOSE, CAPILLARY
Glucose-Capillary: 137 mg/dL — ABNORMAL HIGH (ref 65–99)
Glucose-Capillary: 118 mg/dL — ABNORMAL HIGH (ref 65–99)
Glucose-Capillary: 115 mg/dL — ABNORMAL HIGH (ref 65–99)
Glucose-Capillary: 163 mg/dL — ABNORMAL HIGH (ref 65–99)

## 2016-05-27 LAB — CBC
HCT: 40.4 % (ref 36.0–46.0)
HCT: 39.9 % (ref 36.0–46.0)
HEMOGLOBIN: 12.9 g/dL (ref 12.0–15.0)
Hemoglobin: 12.7 g/dL (ref 12.0–15.0)
MCH: 26.7 pg (ref 26.0–34.0)
MCH: 26.4 pg (ref 26.0–34.0)
MCHC: 31.9 g/dL (ref 30.0–36.0)
MCHC: 31.8 g/dL (ref 30.0–36.0)
MCV: 83.5 fL (ref 78.0–100.0)
MCV: 83 fL (ref 78.0–100.0)
Platelets: 267 10*3/uL (ref 150–400)
Platelets: 250 10*3/uL (ref 150–400)
RBC: 4.84 MIL/uL (ref 3.87–5.11)
RBC: 4.81 MIL/uL (ref 3.87–5.11)
RDW: 16.1 % — ABNORMAL HIGH (ref 11.5–15.5)
RDW: 15.8 % — AB (ref 11.5–15.5)
WBC: 10.8 10*3/uL — ABNORMAL HIGH (ref 4.0–10.5)
WBC: 10.9 10*3/uL — ABNORMAL HIGH (ref 4.0–10.5)

## 2016-05-27 LAB — CREATININE, SERUM
CREATININE: 1.2 mg/dL — AB (ref 0.44–1.00)
GFR calc Af Amer: 57 mL/min — ABNORMAL LOW (ref >60)
GFR, EST NON AFRICAN AMERICAN: 50 mL/min — AB (ref >60)

## 2016-05-27 LAB — PROTIME-INR
INR: 0.99
Prothrombin Time: 13.1 seconds (ref 11.4–15.2)

## 2016-05-27 LAB — HEPARIN LEVEL (UNFRACTIONATED): HEPARIN UNFRACTIONATED: 0.59 [IU]/mL (ref 0.30–0.70)

## 2016-05-27 LAB — HCG, QUANTITATIVE, PREGNANCY: hCG, Beta Chain, Quant, S: <1 m[IU]/mL (ref <5)

## 2016-05-27 SURGERY — LEFT HEART CATH AND CORONARY ANGIOGRAPHY
Anesthesia: LOCAL

## 2016-05-27 MED ORDER — SODIUM CHLORIDE 0.9% FLUSH
3.0000 mL | Freq: Two times a day (BID) | INTRAVENOUS | Status: DC
  Administered 2016-05-28: 3 mL via INTRAVENOUS

## 2016-05-27 MED ORDER — HEPARIN SODIUM (PORCINE) 1000 UNIT/ML IJ SOLN
INTRAMUSCULAR | Status: AC
  Filled 2016-05-27: qty 1

## 2016-05-27 MED ORDER — MIDAZOLAM HCL 2 MG/2ML IJ SOLN
INTRAMUSCULAR | Status: DC | PRN
  Administered 2016-05-27: 1 mg via INTRAVENOUS

## 2016-05-27 MED ORDER — FENTANYL CITRATE (PF) 100 MCG/2ML IJ SOLN
INTRAMUSCULAR | Status: AC
  Filled 2016-05-27: qty 2

## 2016-05-27 MED ORDER — OXYCODONE HCL 5 MG PO TABS
5.0000 mg | ORAL_TABLET | Freq: Once | ORAL | Status: AC
  Administered 2016-05-27: 19:00:00 5 mg via ORAL

## 2016-05-27 MED ORDER — IOPAMIDOL (ISOVUE-370) INJECTION 76%
INTRAVENOUS | Status: AC
  Filled 2016-05-27: qty 100

## 2016-05-27 MED ORDER — INSULIN ASPART 100 UNIT/ML ~~LOC~~ SOLN
0.0000 [IU] | Freq: Three times a day (TID) | SUBCUTANEOUS | Status: DC
  Administered 2016-05-28 – 2016-05-29 (×5): 3 [IU] via SUBCUTANEOUS

## 2016-05-27 MED ORDER — ACETAMINOPHEN 325 MG PO TABS: 650.0000 mg | ORAL_TABLET | ORAL | Status: DC | PRN

## 2016-05-27 MED ORDER — LIDOCAINE HCL (PF) 1 % IJ SOLN
INTRAMUSCULAR | Status: DC | PRN
  Administered 2016-05-27: 2 mL

## 2016-05-27 MED ORDER — LIDOCAINE HCL 1 % IJ SOLN
INTRAMUSCULAR | Status: AC
  Filled 2016-05-27: qty 20

## 2016-05-27 MED ORDER — ASPIRIN 81 MG PO CHEW: 81.0000 mg | CHEWABLE_TABLET | Freq: Every day | ORAL | Status: DC

## 2016-05-27 MED ORDER — IOPAMIDOL (ISOVUE-370) INJECTION 76%
INTRAVENOUS | Status: DC | PRN
  Administered 2016-05-27: 80 mL via INTRAVENOUS

## 2016-05-27 MED ORDER — VERAPAMIL HCL 2.5 MG/ML IV SOLN
INTRAVENOUS | Status: AC
  Filled 2016-05-27: qty 2

## 2016-05-27 MED ORDER — HEPARIN SODIUM (PORCINE) 5000 UNIT/ML IJ SOLN
5000.0000 [IU] | Freq: Three times a day (TID) | INTRAMUSCULAR | Status: DC
  Administered 2016-05-28 – 2016-05-30 (×7): 5000 [IU] via SUBCUTANEOUS
  Filled 2016-05-27 (×7): qty 1

## 2016-05-27 MED ORDER — SODIUM CHLORIDE 0.9 % IV SOLN: 250.0000 mL | INTRAVENOUS | Status: DC | PRN

## 2016-05-27 MED ORDER — MIDAZOLAM HCL 2 MG/2ML IJ SOLN
INTRAMUSCULAR | Status: AC
  Filled 2016-05-27: qty 2

## 2016-05-27 MED ORDER — HEPARIN SODIUM (PORCINE) 1000 UNIT/ML IJ SOLN
INTRAMUSCULAR | Status: DC | PRN
  Administered 2016-05-27: 6500 [IU] via INTRAVENOUS

## 2016-05-27 MED ORDER — HEPARIN (PORCINE) IN NACL 2-0.9 UNIT/ML-% IJ SOLN
INTRAMUSCULAR | Status: DC | PRN
  Administered 2016-05-27: 1500 mL

## 2016-05-27 MED ORDER — METOLAZONE 5 MG PO TABS
5.0000 mg | ORAL_TABLET | Freq: Every day | ORAL | Status: AC
  Administered 2016-05-27 – 2016-05-28 (×2): 5 mg via ORAL
  Filled 2016-05-27 (×2): qty 1

## 2016-05-27 MED ORDER — ONDANSETRON HCL 4 MG/2ML IJ SOLN: 4.0000 mg | Freq: Four times a day (QID) | INTRAMUSCULAR | Status: DC | PRN

## 2016-05-27 MED ORDER — VERAPAMIL HCL 2.5 MG/ML IV SOLN
INTRAVENOUS | Status: DC | PRN
  Administered 2016-05-27: 10 mL via INTRA_ARTERIAL

## 2016-05-27 MED ORDER — FENTANYL CITRATE (PF) 100 MCG/2ML IJ SOLN
INTRAMUSCULAR | Status: DC | PRN
  Administered 2016-05-27: 50 ug via INTRAVENOUS

## 2016-05-27 MED ORDER — LOSARTAN POTASSIUM 50 MG PO TABS
50.0000 mg | ORAL_TABLET | Freq: Every day | ORAL | Status: DC
  Administered 2016-05-27 – 2016-05-28 (×2): 50 mg via ORAL
  Filled 2016-05-27 (×2): qty 1

## 2016-05-27 MED ORDER — SODIUM CHLORIDE 0.9% FLUSH: 3.0000 mL | INTRAVENOUS | Status: DC | PRN

## 2016-05-27 MED ORDER — AMOXICILLIN-POT CLAVULANATE 875-125 MG PO TABS
1.0000 | ORAL_TABLET | Freq: Two times a day (BID) | ORAL | Status: DC
  Administered 2016-05-27 – 2016-05-30 (×7): 1 via ORAL
  Filled 2016-05-27 (×7): qty 1

## 2016-05-27 MED ORDER — OXYCODONE-ACETAMINOPHEN 5-325 MG PO TABS
1.0000 | ORAL_TABLET | ORAL | Status: DC | PRN
  Administered 2016-05-30: 09:00:00 2 via ORAL
  Filled 2016-05-27: qty 2

## 2016-05-27 MED ORDER — HEPARIN (PORCINE) IN NACL 2-0.9 UNIT/ML-% IJ SOLN
INTRAMUSCULAR | Status: AC
  Filled 2016-05-27: qty 1500

## 2016-05-27 SURGICAL SUPPLY — 13 items
CATH INFINITI 5 FR JL3.5 (CATHETERS) ×2 IMPLANT
CATH INFINITI JR4 5F (CATHETERS) ×2 IMPLANT
COVER PRB 48X5XTLSCP FOLD TPE (BAG) ×1 IMPLANT
COVER PROBE 5X48 (BAG) ×1
DEVICE RAD COMP TR BAND LRG (VASCULAR PRODUCTS) ×2 IMPLANT
GLIDESHEATH SLEND A-KIT 6F 22G (SHEATH) ×2 IMPLANT
GUIDEWIRE INQWIRE 1.5J.035X260 (WIRE) ×1 IMPLANT
HOVERMATT SINGLE USE (MISCELLANEOUS) ×2 IMPLANT
INQWIRE 1.5J .035X260CM (WIRE) ×2
KIT HEART LEFT (KITS) ×2 IMPLANT
PACK CARDIAC CATHETERIZATION (CUSTOM PROCEDURE TRAY) ×2 IMPLANT
TRANSDUCER W/STOPCOCK (MISCELLANEOUS) ×2 IMPLANT
TUBING CIL FLEX 10 FLL-RA (TUBING) ×2 IMPLANT

## 2016-05-27 NOTE — Progress Notes (Signed)
ANTICOAGULATION CONSULT NOTE - follow up  Pharmacy Consult for IV heparin  Indication: chest pain/ACS  Allergies  Allergen Reactions  . Lisinopril Other (See Comments)    Cough    Patient Measurements: Height: 5' 7.5" (171.5 cm) Weight: (!) 341 lb (154.7 kg) IBW/kg (Calculated) : 62.75 Heparin Dosing Weight: 102 kg  Vital Signs: Temp: 99.2 F (37.3 C) (05/08 0620) Temp Source: Oral (05/08 0620) BP: 105/64 (05/08 0620) Pulse Rate: 67 (05/08 0620)  Labs:  Recent Labs  05/26/16 0524 05/26/16 0529 05/26/16 1340 05/26/16 2159 05/27/16 0556  HGB  --  13.6  --   --  12.9  HCT  --  41.8  --   --  40.4  PLT  --  261  --   --  267  HEPARINUNFRC  --  0.20* 0.44 0.55 0.59  CREATININE 1.09*  --   --   --   --     Estimated Creatinine Clearance: 90.6 mL/min (A) (by C-G formula based on SCr of 1.09 mg/dL (H)).   Medical History: Past Medical History:  Diagnosis Date  . Anxiety   . Arthritis   . Diabetes mellitus without complication (HCC)   . Hypertension     Medications:  Scheduled:  . aspirin  81 mg Oral Daily  . carvedilol  6.25 mg Oral BID WC  . famotidine  40 mg Oral BID  . FLUoxetine  40 mg Oral Daily  . furosemide  40 mg Intravenous BID  . gabapentin  600 mg Oral Daily  . gabapentin  900 mg Oral QHS  . insulin aspart protamine- aspart  35 Units Subcutaneous BID WC  . ipratropium-albuterol  3 mL Nebulization Q4H  . losartan  50 mg Oral Daily  . polyethylene glycol  17 g Oral Daily  . sodium chloride flush  3 mL Intravenous Q12H  . sodium chloride flush  3 mL Intravenous Q12H  . spironolactone  25 mg Oral Daily   Infusions:  . sodium chloride    . sodium chloride    . sodium chloride 10 mL/hr at 05/27/16 0617  . heparin 1,800 Units/hr (05/27/16 0617)    Assessment: 56 yo female with new onset HF followed by cards as inpatient. Pt's currently on IV heparin for new CP for possible ACS.  Today, 05/27/2016: - heparin level remains therapeutic at 0.59 -  cbc stable - no bleeding documented - Cardiac cath on 5/8   Goal of Therapy:  Heparin level 0.3-0.7 units/ml Monitor platelets by anticoagulation protocol: Yes   Plan:  - continue heparin drip at 1800 units/hr - monitor for s/s bleeding - f/u with patient after cath procedure today  Lucia Gaskinsham, Hydeia Mcatee P 05/27/2016 7:32 AM

## 2016-05-27 NOTE — H&P (View-Only) (Signed)
Progress Note  Patient Name: Amber Melendez Date of Encounter: 05/27/2016  Primary Cardiologist: Dr. Mayford Knife  Subjective   She feels SOB, no chest pain.  Inpatient Medications    Scheduled Meds: . aspirin  81 mg Oral Daily  . carvedilol  6.25 mg Oral BID WC  . famotidine  40 mg Oral BID  . FLUoxetine  40 mg Oral Daily  . furosemide  40 mg Intravenous BID  . gabapentin  600 mg Oral Daily  . gabapentin  900 mg Oral QHS  . insulin aspart protamine- aspart  35 Units Subcutaneous BID WC  . ipratropium-albuterol  3 mL Nebulization Q4H  . losartan  50 mg Oral Daily  . polyethylene glycol  17 g Oral Daily  . sodium chloride flush  3 mL Intravenous Q12H  . sodium chloride flush  3 mL Intravenous Q12H  . spironolactone  25 mg Oral Daily   Continuous Infusions: . sodium chloride    . sodium chloride    . sodium chloride 10 mL/hr at 05/27/16 0617  . heparin 1,800 Units/hr (05/27/16 0617)   PRN Meds: sodium chloride, sodium chloride, acetaminophen, albuterol, diphenhydrAMINE, gi cocktail, magic mouthwash w/lidocaine, menthol-cetylpyridinium, nicotine polacrilex, ondansetron (ZOFRAN) IV, oxycodone, sodium chloride flush, sodium chloride flush   Vital Signs    Vitals:   05/26/16 2140 05/27/16 0618 05/27/16 0620 05/27/16 0846  BP: 108/83  105/64   Pulse: (!) 55  67   Resp: 20  20   Temp: 98.2 F (36.8 C)  99.2 F (37.3 C)   TempSrc: Oral  Oral   SpO2: 98%  96% 95%  Weight:  (!) 341 lb (154.7 kg)    Height:        Intake/Output Summary (Last 24 hours) at 05/27/16 0902 Last data filed at 05/27/16 0823  Gross per 24 hour  Intake            341.7 ml  Output              800 ml  Net           -458.3 ml   Filed Weights   05/25/16 0528 05/26/16 0530 05/27/16 0618  Weight: (!) 341 lb 14.4 oz (155.1 kg) (!) 342 lb 1.6 oz (155.2 kg) (!) 341 lb (154.7 kg)    Telemetry    NSR- Personally Reviewed  ECG    No new EKG - Personally Reviewed  Physical Exam   GEN: WD, WN in  NAD Neck: No JVD or bruit Cardiac: RRR with no M/R/G.  Respiratory: wheezing B/L GI: soft, NT, ND with active BS MS: no edema or deformity Neuro:  A&O x 3 Psych: normal affect  Labs    Chemistry  Recent Labs Lab 05/23/16 0634 05/24/16 0611 05/26/16 0524  NA 137 137 135  K 3.9 3.9 3.7  CL 101 101 99*  CO2 26 26 27   GLUCOSE 127* 136* 134*  BUN 17 14 11   CREATININE 1.25* 1.16* 1.09*  CALCIUM 9.0 9.1 9.0  GFRNONAA 47* 52* 56*  GFRAA 55* 60* >60  ANIONGAP 10 10 9      Hematology  Recent Labs Lab 05/22/16 0553 05/26/16 0529 05/27/16 0556  WBC 10.4 9.9 10.8*  RBC 4.97 5.02 4.84  HGB 13.5 13.6 12.9  HCT 41.0 41.8 40.4  MCV 82.5 83.3 83.5  MCH 27.2 27.1 26.7  MCHC 32.9 32.5 31.9  RDW 16.2* 16.0* 16.1*  PLT 285 261 267    Cardiac Enzymes  Recent Labs Lab  05/20/16 1022  TROPONINI <0.03   No results for input(s): TROPIPOC in the last 168 hours.   BNP  Recent Labs Lab 05/23/16 0634  BNP 120.3*     DDimer No results for input(s): DDIMER in the last 168 hours.   Radiology    [place] Abdomen Complete  Result Date: 05/23/2016 CLINICAL DATA:  Abdominal pain. EXAM: ABDOMEN ULTRASOUND COMPLETE COMPARISON:  None. FINDINGS: Gallbladder: No gallstones or wall thickening visualized. No sonographic Murphy sign noted by sonographer. Common bile duct: Diameter: 3.7 mm Liver: Diffuse increased echogenicity with no suspicious masses. A 2.9 cm cyst is seen in the right hepatic lobe. IVC: No abnormality visualized. Pancreas: Visualized portion unremarkable. Spleen: Size and appearance within normal limits. Right Kidney: Length: 11.2 cm. Echogenicity within normal limits. No mass or hydronephrosis visualized. Left Kidney: Length: 11.5 cm. Echogenicity within normal limits. No mass or hydronephrosis visualized. Abdominal aorta: No aneurysm visualized. Other findings: None. IMPRESSION: 1. Probable hepatic steatosis. No other abnormalities identified. The study was limited by patient  body habitus and shadowing bowel gas. Electronically Signed   By: Gerome Samavid  Williams III M.D   On: 05/23/2016 21:34   Nm Myocar Multi W/spect Izetta DakinW/wall Motion / Ef  Addendum Date: 05/24/2016    There was no ST segment deviation noted during stress.  No T wave inversion was noted during stress.  There is a large defect of severe severity present in the basal anterior, basal anteroseptal, basal inferoseptal, basal inferior, basal inferolateral, basal anterolateral, mid anterior, mid anteroseptal, mid inferoseptal, mid inferior, mid inferolateral, mid anterolateral, apical anterior, apical inferior, apical lateral and apex location. The defect is reversible and consistent with ischemia. This is a very poor quality study and counts are diffusely low in all areas on stress images.  The left ventricular ejection fraction is mildly decreased (45-54%).  Nuclear stress EF: 45%.  Unclear whether this represents true ischemia in all regions or is due to poor image quality in processing on stress imaging. In setting of LV dysfunction, recommend cardiac cath for further evaluation for CAD  Findings consistent with ischemia.  This is a high risk study.     Cardiac Studies   05/20/2016 2D echo Study Conclusions  - Left ventricle: The cavity size was normal. Wall thickness was   normal. Systolic function was moderately to severely reduced. The   estimated ejection fraction was in the range of 30% to 35%.   Diffuse hypokinesis. Features are consistent with a pseudonormal   left ventricular filling pattern, with concomitant abnormal   relaxation and increased filling pressure (grade 2 diastolic   dysfunction). - Left atrium: The atrium was moderately dilated. - Right atrium: The atrium was mildly dilated.  Impressions:  - Moderate to severe global reduction in LV systolic function;   moderate diastolic dysfunction; biatrial enlargement.  Telemetry: SR, nsVT up to 10 beats, polymorphic, possibly  ischemic    Patient Profile     56 y.o. female  with a PMH significant for HTN, DM, arthritis, and anxiety.  She has no cardiac history but has a family history of premature CAD with her sister dying in her 2240's of an MI.  She also smokes 1/2 ppd of cigarettes.  She presented to the ER with complaints of LE edema, orthopnea and DOE and CXRAY showed pulmonary edema.  She also complained of chest discomfort that she describes as a cramp in her epigastric region and atypical in that it improves with massage of the area.  2D echo showed  moderately reduced LVF with EF 30-35% with diffuse HK and moderate BAE.  Trop is neg x 2 and BNP elevated at 249.  She tells me that she had a severe viral respiratory infection a month ago and is still recovering from it so this   Assessment & Plan    1. 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 Viral CM as she had a severe viral respiratory infection about a month ago vs. CAD - BNP 249.4 on admission - troponin x 2 negative (0.03 --> <0.03) - EKG with NSR with frequent ectopy - overall net negative 3L , no improvement in SOB yet  - weight on admission was 338 lbs, today she is 341 lbs - dry weight not yet determined - poorly controlled HTN likely contributing - She does not appear markedly volume overloaded today.  There is no LE edema and no appreciable JVD although difficult to assess due to obesity.  Can assess LVEDP at cath today.   -  Lasix increased to 40mg  IV BID yesterday  2.  Uncontrolled hypertension - now controlled - may be due to noncompliance with meds and sodium - continue ARB, Carvedilol and aldactone.  3.  Chest pain -  - atypical  - 2 day lexiscan myoview markedly abnormal with reduced counts throughout compared to resting images.  Unclear whether this is related to processing or actually represents ischemia.  Considered a high risk scan in setting of LV dysfunction.   - Cardiac catheterization  scheduled for 3pm today - continue IV Heparin gtt  4. nsVT - up to 10 beats, carvedilol increased to 6.25 mg po BID  5. AKI - sCr 1.09 yesteday (1.09 admit) - monitor kidney function on PO diuretics - if sCr continues to trend up any further, will back off on lasix  Plan- Cath today- will hold Cozaar and Aldactone today pre cath with history of renal insufficiency and low B/P this am.   Signed, Corine Shelter, PA-C  05/27/2016, 9:02 AM    The patient was seen, examined and discussed with Corine Shelter, PA-C and I agree with the above.   56 y.o. female with a PMH significant for HTN, DM, arthritis, and anxiety, morbid obesity, ongoing smoking, no cardiac history but has a family history of premature CAD with her sister dying in her 23's of an MI. The patient was admitted with new onset acute systolic CHF LVEF 30-35% with diffuse HK . Complaining of atypical chest pain, troponin is neg x 2 and BNP 249. She has had a recent severe viral respiratory infection a month ago and is still recovering from it.  Plan:  - right and left cardiac cath today, diff CAD vs myocarditis, if normal coronaries, we will plan for a cardiac MRI if she fits the scanner - she has had minimal diuresis on iv lasix, we will await LVEDP - she has had significant PVCs and nsVT up to 10 beats that has resolved after initiation of carvedilol, we will follow, if normal coronaries, we will start LifeVest until LVEF > 35%  Tobias Alexander, MD 05/27/2016

## 2016-05-27 NOTE — Progress Notes (Signed)
TR BAND REMOVAL  LOCATION:    right radial  DEFLATED PER PROTOCOL:    Yes.    TIME BAND OFF / DRESSING APPLIED:    1900   SITE UPON ARRIVAL:    Level 0  SITE AFTER BAND REMOVAL:    Level 0  CIRCULATION SENSATION AND MOVEMENT:    Within Normal Limits   Yes.    COMMENTS:   Tolerated procedure well 

## 2016-05-27 NOTE — Interval H&P Note (Signed)
Cath Lab Visit (complete for each Cath Lab visit)  Clinical Evaluation Leading to the Procedure:   ACS: No.  Non-ACS:    Anginal Classification: CCS III  Anti-ischemic medical therapy: Minimal Therapy (1 class of medications)  Non-Invasive Test Results: No non-invasive testing performed  Prior CABG: No previous CABG      History and Physical Interval Note:  05/27/2016 3:55 PM  Amber Melendez  has presented today for surgery, with the diagnosis of cp  The various methods of treatment have been discussed with the patient and family. After consideration of risks, benefits and other options for treatment, the patient has consented to  Procedure(s): Left Heart Cath and Coronary Angiography (N/A) as a surgical intervention .  The patient's history has been reviewed, patient examined, no change in status, stable for surgery.  I have reviewed the patient's chart and labs.  Questions were answered to the patient's satisfaction.     Lyn RecordsHenry W Smith III

## 2016-05-27 NOTE — Progress Notes (Signed)
ANTICOAGULATION CONSULT NOTE - Follow Up Consult  Pharmacy Consult for Heparin Indication: chest pain/ACS  Allergies  Allergen Reactions  . Lisinopril Other (See Comments)    Cough    Patient Measurements: Height: 5' 7.5" (171.5 cm) Weight: (!) 342 lb 1.6 oz (155.2 kg) IBW/kg (Calculated) : 62.75 Heparin Dosing Weight:   Vital Signs: Temp: 98.2 F (36.8 C) (05/07 2140) Temp Source: Oral (05/07 2140) BP: 108/83 (05/07 2140) Pulse Rate: 55 (05/07 2140)  Labs:  Recent Labs  05/24/16 [id number]  05/26/16 0524 05/26/16 0529 05/26/16 1340 05/26/16 2159  HGB  --   --   --  13.6  --   --   HCT  --   --   --  41.8  --   --   PLT  --   --   --  261  --   --   HEPARINUNFRC  --   < >  --  0.20* 0.44 0.55  CREATININE 1.16*  --  1.09*  --   --   --   < > = values in this interval not displayed.  Estimated Creatinine Clearance: 90.8 mL/min (A) (by C-G formula based on SCr of 1.09 mg/dL (H)).   Medications:  Infusions:  . sodium chloride    . sodium chloride    . sodium chloride    . heparin 1,800 Units/hr (05/26/16 1741)    Assessment: Patient with heparin level at goal again.  No heparin issues noted.  Goal of Therapy:  Heparin level 0.3-0.7 units/ml Monitor platelets by anticoagulation protocol: Yes   Plan:  Continue heparin drip at current rate Recheck level with AM labs  Aleene DavidsonGrimsley Jr, Vernette Moise Crowford 05/27/2016,2:02 AM

## 2016-05-27 NOTE — Progress Notes (Signed)
Occupational Therapy Treatment Patient Details Name: Amber Melendez MRN: [id number] DOB: 09/20/1960 Today's Date: 05/27/2016    History of present illness Pt is a 56 y/o F with PMHx including HTN, DM2.  Patient presents to the ED with c/o 1 week history of LE swelling R>L, orthopnea, chest pressure, SOB worse when laying down, and DOE. Pt also reports low back pain, R shoulder pain   OT comments  Limited tx:  Provided level 2 theraband HEP to use in painfree range. Will check back one more time to make sure she is independent with HEP and to make any modifications as needed  Follow Up Recommendations       Equipment Recommendations       Recommendations for Other Services      Precautions / Restrictions Precautions Precautions: None       Mobility Bed Mobility                  Transfers                      Balance                                           ADL either performed or assessed with clinical judgement   ADL                                         General ADL Comments: pt states that she is not having difficulty with adls nor getting up to bathroom.  She is able to bring leg up onto bed to reach feet She is able to move LUE much better:  to 90 degrees painfree.  Provided orange theraband and written program to work within painfree range.       Vision       Perception     Praxis      Cognition Arousal/Alertness: Awake/alert Behavior During Therapy: WFL for tasks assessed/performed Overall Cognitive Status: Within Functional Limits for tasks assessed                                          Exercises Exercises: Other exercises Other Exercises Other Exercises: level 2 theraband:  FF to 90; biceps; horizontal abduction below 90.  Pt only performed one rep of each:  states she is tired.  She will have a test today   Shoulder Instructions       General Comments      Pertinent Vitals/  Pain       Pain Assessment: No/denies pain  Home Living                                          Prior Functioning/Environment              Frequency           Progress Toward Goals  OT Goals(current goals can now be found in the care plan section)  Progress towards OT goals: Progressing toward goals     Plan      Co-evaluation  AM-PAC PT "6 Clicks" Daily Activity     Outcome Measure   Help from another person eating meals?: None Help from another person taking care of personal grooming?: A Little Help from another person toileting, which includes using toliet, bedpan, or urinal?: A Little Help from another person bathing (including washing, rinsing, drying)?: A Little Help from another person to put on and taking off regular upper body clothing?: A Little Help from another person to put on and taking off regular lower body clothing?: A Little 6 Click Score: 19    End of Session    OT Visit Diagnosis: Muscle weakness (generalized) (M62.81);Pain Pain - Right/Left: Right Pain - part of body: Shoulder   Activity Tolerance Patient limited by fatigue   Patient Left in bed;with call bell/phone within reach   Nurse Communication          Time: 1610-9604 OT Time Calculation (min): 9 min  Charges: OT General Charges $OT Visit: 1 Procedure OT Treatments $Therapeutic Exercise: 8-22 mins  Amber Melendez, Amber Melendez [phone] 05/27/2016   Gimena Buick 05/27/2016, 2:05 PM

## 2016-05-27 NOTE — Progress Notes (Signed)
PROGRESS NOTE [facility]   Amber Melendez   BJY:[id number] DOB: 1960-03-19  DOA: 05/19/2016 PCP: Elpidio Eric, MD   Brief Narrative:  Patient is 5 mL-year-old female with past medical history of hypertension diabetes presented to the emergency department with lower extremity edema, orthopnea and generalized breath. She was admitted with findings of pulmonary edema and was found to have new diagnosis of CHF with low ejection fraction. Cardiology was consulted recommended nuclear stress tests which came high risk for CAD. Now recommending cardiac cath.   Subjective: Patient seen and examined, felt slight SOB this morning, improved with neb treatment. For cardiac cath today.   Assessment & Plan: Acute combining systolic and diastolic congestive heart failure EF 30-35% with diffuses HK  On BB, ARB, Aldactone and Lasix. - Holding Cozaar and Aldactone pre procedure  Strict I&O's, fluid restriction, monitor daily weights Nuclear stress test came high risk for ischemia - For cardiac cath today  Patient high risk for CAD, smoker, obese, diabetes mellitus, hypertension and family history of early cardiac death. Had 10 beats of nsVT - Coreg increased to 6.25 mg - no has resolved  Continue current regimen   Thyroid nodules x 2   Solid isoechoic nodules, no need for follow up as nodules are < 1cm TSH normal   GERD - in view of obesity and hiatal hernia - improved Continue current regimen: GI cocktail, Pepcid 40 mg BID Continue to monitor   Acute kidney injury -  Improved - 2/2 cardiorenal syndrome, initial chest x-ray showed signs of pulmonary edema.  Cr Improved with diuresis. Will check BMP in AM post cath   Essential hypertension- currently stable Patient on beta blocker, ARB's, Aldactone and Lasix - holding ARB and Cozaar pre procedure  Monitor BP  Tobacco abuse Cessation discussed Nicotine gum   Lower extremity edema right>left - improved  No DVT   Diabetes mellitus  type 2 - CBGs currently stable A1c 7.5 - 05/22/16 Continue current regimen Monitor CBGs.  Tooth abscess - present prior to admission, was getting treatment with abx. Now hurting againg Will start Augmentin  Needs follow up with a dentist as outpatient   DVT prophylaxis: Lovenox Code Status: Full Family Communication: Family member at bedside Disposition Plan: Pending cardiology clearance   Consultants:   Cardiology  Procedures:   ECHO 05/20/16 ------------------------------------------------------------------- Study Conclusions  - Left ventricle: The cavity size was normal. Wall thickness was   normal. Systolic function was moderately to severely reduced. The   estimated ejection fraction was in the range of 30% to 35%.   Diffuse hypokinesis. Features are consistent with a pseudonormal   left ventricular filling pattern, with concomitant abnormal   relaxation and increased filling pressure (grade 2 diastolic   dysfunction). - Left atrium: The atrium was moderately dilated. - Right atrium: The atrium was mildly dilated.  Impressions:  - Moderate to severe global reduction in LV systolic function;   moderate diastolic dysfunction; biatrial enlargement.  Antimicrobials: Anti-infectives    Start     Dose/Rate Route Frequency Ordered Stop   05/27/16 1130  amoxicillin-clavulanate (AUGMENTIN) 875-125 MG per tablet 1 tablet     1 tablet Oral Every 12 hours 05/27/16 1116 06/01/16 0959         Objective: Vitals:   05/26/16 2140 05/27/16 0618 05/27/16 0620 05/27/16 0846  BP: 108/83  105/64   Pulse: (!) 55  67   Resp: 20  20   Temp: 98.2 F (36.8 C)  99.2 F (37.3 C)  TempSrc: Oral  Oral   SpO2: 98%  96% 95%  Weight:  (!) 154.7 kg (341 lb)    Height:        Intake/Output Summary (Last 24 hours) at 05/27/16 1258 Last data filed at 05/27/16 1223  Gross per 24 hour  Intake            517.6 ml  Output             1100 ml  Net           -582.4 ml   Filed Weights     05/25/16 0528 05/26/16 0530 05/27/16 0618  Weight: (!) 155.1 kg (341 lb 14.4 oz) (!) 155.2 kg (342 lb 1.6 oz) (!) 154.7 kg (341 lb)    Examination:  General exam: NAD, tearful  Respiratory system: Good air entry, no crackles noted Cardiovascular system: S1S2 RRR  Extremities: Trace LE extremity edema  Psychiatry: Mood depressed and anxious   Data Reviewed: I have personally reviewed following labs and imaging studies  CBC:  Recent Labs Lab 05/22/16 0553 05/26/16 0529 05/27/16 0556  WBC 10.4 9.9 10.8*  NEUTROABS 6.2  --   --   HGB 13.5 13.6 12.9  HCT 41.0 41.8 40.4  MCV 82.5 83.3 83.5  PLT 285 261 267   Basic Metabolic Panel:  Recent Labs Lab 05/21/16 0528 05/22/16 0553 05/23/16 0634 05/24/16 0611 05/26/16 0524  NA 138 138 137 137 135  K 3.8 3.5 3.9 3.9 3.7  CL 100* 99* 101 101 99*  CO2 27 27 26 26 27   GLUCOSE 145* 130* 127* 136* 134*  BUN 18 20 17 14 11   CREATININE 1.22* 1.33* 1.25* 1.16* 1.09*  CALCIUM 9.3 9.0 9.0 9.1 9.0   GFR: Estimated Creatinine Clearance: 90.6 mL/min (A) (by C-G formula based on SCr of 1.09 mg/dL (H)). Liver Function Tests: No results for input(s): AST, ALT, ALKPHOS, BILITOT, PROT, ALBUMIN in the last 168 hours. No results for input(s): LIPASE, AMYLASE in the last 168 hours. No results for input(s): AMMONIA in the last 168 hours. Coagulation Profile: No results for input(s): INR, PROTIME in the last 168 hours. Cardiac Enzymes: No results for input(s): CKTOTAL, CKMB, CKMBINDEX, TROPONINI in the last 168 hours. BNP (last 3 results) No results for input(s): PROBNP in the last 8760 hours. HbA1C: No results for input(s): HGBA1C in the last 72 hours. CBG:  Recent Labs Lab 05/26/16 1147 05/26/16 1642 05/26/16 2137 05/27/16 0804 05/27/16 1131  GLUCAP 109* 115* 148* 137* 118*   Lipid Profile: No results for input(s): CHOL, HDL, LDLCALC, TRIG, CHOLHDL, LDLDIRECT in the last 72 hours. Thyroid Function Tests:  Recent Labs   05/25/16 1612  TSH 2.721    No results found for this or any previous visit (from the past 240 hour(s)).    Radiology Studies: [place] Thyroid  Result Date: 05/26/2016 CLINICAL DATA:  Thyroid enlargement on exam EXAM: THYROID ULTRASOUND TECHNIQUE: Ultrasound examination of the thyroid gland and adjacent soft tissues was performed. COMPARISON:  None available FINDINGS: Parenchymal Echotexture: Mildly heterogenous Isthmus: 4 mm Right lobe: 4.2 x 2.0 x 2.4 cm Left lobe: 4.6 x 2.0 x 2.1 cm _________________________________________________________ Estimated total number of nodules >/= 1 cm: 1 Number of spongiform nodules >/=  2 cm not described below (TR1): 0 Number of mixed cystic and solid nodules >/= 1.5 cm not described below (TR2): 0 _________________________________________________________ Nodule # 1: Location: Right; Inferior Maximum size: 1.0 cm; Other 2 dimensions: 0.6 x 0.9 cm Composition: solid/almost completely  solid (2) Echogenicity: isoechoic (1) Shape: not taller-than-wide (0) Margins: ill-defined (0) Echogenic foci: none (0) ACR TI-RADS total points: 3. ACR TI-RADS risk category: TR3 (3 points). ACR TI-RADS recommendations: Given size (<1.4 cm) and appearance, this nodule does NOT meet TI-RADS criteria for biopsy or dedicated follow-up. _________________________________________________________ Nodule # 2: Location: Left; Mid Maximum size: 0.9 cm; Other 2 dimensions: 0.5 x 0.7 cm Composition: solid/almost completely solid (2) Echogenicity: isoechoic (1) Shape: not taller-than-wide (0) Margins: ill-defined (0) Echogenic foci: none (0) ACR TI-RADS total points: 3. ACR TI-RADS risk category: TR3 (3 points). ACR TI-RADS recommendations: Given size (<1.4 cm) and appearance, this nodule does NOT meet TI-RADS criteria for biopsy or dedicated follow-up. _________________________________________________________ IMPRESSION: Small bilateral solid isoechoic TR 3 nodules, as detailed above. These do not meet  criteria for biopsy or follow-up. The above is in keeping with the ACR TI-RADS recommendations - J Am Coll Radiol 2017;14:587-595. Electronically Signed   By: Judie Petit.  Shick M.D.   On: 05/26/2016 12:29    Scheduled Meds: . amoxicillin-clavulanate  1 tablet Oral Q12H  . aspirin  81 mg Oral Daily  . carvedilol  6.25 mg Oral BID WC  . famotidine  40 mg Oral BID  . FLUoxetine  40 mg Oral Daily  . furosemide  40 mg Intravenous BID  . gabapentin  600 mg Oral Daily  . gabapentin  900 mg Oral QHS  . insulin aspart  0-20 Units Subcutaneous TID WC  . insulin aspart protamine- aspart  35 Units Subcutaneous BID WC  . ipratropium-albuterol  3 mL Nebulization Q4H  . [START ON 05/28/2016] losartan  50 mg Oral Daily  . polyethylene glycol  17 g Oral Daily  . sodium chloride flush  3 mL Intravenous Q12H  . sodium chloride flush  3 mL Intravenous Q12H  . spironolactone  25 mg Oral Daily   Continuous Infusions: . sodium chloride    . sodium chloride    . sodium chloride 10 mL/hr at 05/27/16 0617  . heparin 1,800 Units/hr (05/27/16 0617)     LOS: 6 days    Latrelle Dodrill, MD Pager: Text Page via [email or web address]  [phone]  If 7PM-7AM, please contact night-coverage [email or web address] Password [facility] 05/27/2016, 12:58 PM

## 2016-05-27 NOTE — Progress Notes (Addendum)
Progress Note  Patient Name: Amber Melendez Date of Encounter: 05/27/2016  Primary Cardiologist: Dr. Mayford Knife  Subjective   She feels SOB, no chest pain.  Inpatient Medications    Scheduled Meds: . aspirin  81 mg Oral Daily  . carvedilol  6.25 mg Oral BID WC  . famotidine  40 mg Oral BID  . FLUoxetine  40 mg Oral Daily  . furosemide  40 mg Intravenous BID  . gabapentin  600 mg Oral Daily  . gabapentin  900 mg Oral QHS  . insulin aspart protamine- aspart  35 Units Subcutaneous BID WC  . ipratropium-albuterol  3 mL Nebulization Q4H  . losartan  50 mg Oral Daily  . polyethylene glycol  17 g Oral Daily  . sodium chloride flush  3 mL Intravenous Q12H  . sodium chloride flush  3 mL Intravenous Q12H  . spironolactone  25 mg Oral Daily   Continuous Infusions: . sodium chloride    . sodium chloride    . sodium chloride 10 mL/hr at 05/27/16 0617  . heparin 1,800 Units/hr (05/27/16 0617)   PRN Meds: sodium chloride, sodium chloride, acetaminophen, albuterol, diphenhydrAMINE, gi cocktail, magic mouthwash w/lidocaine, menthol-cetylpyridinium, nicotine polacrilex, ondansetron (ZOFRAN) IV, oxycodone, sodium chloride flush, sodium chloride flush   Vital Signs    Vitals:   05/26/16 2140 05/27/16 0618 05/27/16 0620 05/27/16 0846  BP: 108/83  105/64   Pulse: (!) 55  67   Resp: 20  20   Temp: 98.2 F (36.8 C)  99.2 F (37.3 C)   TempSrc: Oral  Oral   SpO2: 98%  96% 95%  Weight:  (!) 341 lb (154.7 kg)    Height:        Intake/Output Summary (Last 24 hours) at 05/27/16 0902 Last data filed at 05/27/16 0823  Gross per 24 hour  Intake            341.7 ml  Output              800 ml  Net           -458.3 ml   Filed Weights   05/25/16 0528 05/26/16 0530 05/27/16 0618  Weight: (!) 341 lb 14.4 oz (155.1 kg) (!) 342 lb 1.6 oz (155.2 kg) (!) 341 lb (154.7 kg)    Telemetry    NSR- Personally Reviewed  ECG    No new EKG - Personally Reviewed  Physical Exam   GEN: WD, WN in  NAD Neck: No JVD or bruit Cardiac: RRR with no M/R/G.  Respiratory: wheezing B/L GI: soft, NT, ND with active BS MS: no edema or deformity Neuro:  A&O x 3 Psych: normal affect  Labs    Chemistry  Recent Labs Lab 05/23/16 0634 05/24/16 0611 05/26/16 0524  NA 137 137 135  K 3.9 3.9 3.7  CL 101 101 99*  CO2 26 26 27   GLUCOSE 127* 136* 134*  BUN 17 14 11   CREATININE 1.25* 1.16* 1.09*  CALCIUM 9.0 9.1 9.0  GFRNONAA 47* 52* 56*  GFRAA 55* 60* >60  ANIONGAP 10 10 9      Hematology  Recent Labs Lab 05/22/16 0553 05/26/16 0529 05/27/16 0556  WBC 10.4 9.9 10.8*  RBC 4.97 5.02 4.84  HGB 13.5 13.6 12.9  HCT 41.0 41.8 40.4  MCV 82.5 83.3 83.5  MCH 27.2 27.1 26.7  MCHC 32.9 32.5 31.9  RDW 16.2* 16.0* 16.1*  PLT 285 261 267    Cardiac Enzymes  Recent Labs Lab  05/20/16 1022  TROPONINI <0.03   No results for input(s): TROPIPOC in the last 168 hours.   BNP  Recent Labs Lab 05/23/16 0634  BNP 120.3*     DDimer No results for input(s): DDIMER in the last 168 hours.   Radiology    [place] Abdomen Complete  Result Date: 05/23/2016 CLINICAL DATA:  Abdominal pain. EXAM: ABDOMEN ULTRASOUND COMPLETE COMPARISON:  None. FINDINGS: Gallbladder: No gallstones or wall thickening visualized. No sonographic Murphy sign noted by sonographer. Common bile duct: Diameter: 3.7 mm Liver: Diffuse increased echogenicity with no suspicious masses. A 2.9 cm cyst is seen in the right hepatic lobe. IVC: No abnormality visualized. Pancreas: Visualized portion unremarkable. Spleen: Size and appearance within normal limits. Right Kidney: Length: 11.2 cm. Echogenicity within normal limits. No mass or hydronephrosis visualized. Left Kidney: Length: 11.5 cm. Echogenicity within normal limits. No mass or hydronephrosis visualized. Abdominal aorta: No aneurysm visualized. Other findings: None. IMPRESSION: 1. Probable hepatic steatosis. No other abnormalities identified. The study was limited by patient  body habitus and shadowing bowel gas. Electronically Signed   By: Gerome Samavid  Williams III M.D   On: 05/23/2016 21:34   Nm Myocar Multi W/spect Izetta DakinW/wall Motion / Ef  Addendum Date: 05/24/2016    There was no ST segment deviation noted during stress.  No T wave inversion was noted during stress.  There is a large defect of severe severity present in the basal anterior, basal anteroseptal, basal inferoseptal, basal inferior, basal inferolateral, basal anterolateral, mid anterior, mid anteroseptal, mid inferoseptal, mid inferior, mid inferolateral, mid anterolateral, apical anterior, apical inferior, apical lateral and apex location. The defect is reversible and consistent with ischemia. This is a very poor quality study and counts are diffusely low in all areas on stress images.  The left ventricular ejection fraction is mildly decreased (45-54%).  Nuclear stress EF: 45%.  Unclear whether this represents true ischemia in all regions or is due to poor image quality in processing on stress imaging. In setting of LV dysfunction, recommend cardiac cath for further evaluation for CAD  Findings consistent with ischemia.  This is a high risk study.     Cardiac Studies   05/20/2016 2D echo Study Conclusions  - Left ventricle: The cavity size was normal. Wall thickness was   normal. Systolic function was moderately to severely reduced. The   estimated ejection fraction was in the range of 30% to 35%.   Diffuse hypokinesis. Features are consistent with a pseudonormal   left ventricular filling pattern, with concomitant abnormal   relaxation and increased filling pressure (grade 2 diastolic   dysfunction). - Left atrium: The atrium was moderately dilated. - Right atrium: The atrium was mildly dilated.  Impressions:  - Moderate to severe global reduction in LV systolic function;   moderate diastolic dysfunction; biatrial enlargement.  Telemetry: SR, nsVT up to 10 beats, polymorphic, possibly  ischemic    Patient Profile     56 y.o. female  with a PMH significant for HTN, DM, arthritis, and anxiety.  She has no cardiac history but has a family history of premature CAD with her sister dying in her 2240's of an MI.  She also smokes 1/2 ppd of cigarettes.  She presented to the ER with complaints of LE edema, orthopnea and DOE and CXRAY showed pulmonary edema.  She also complained of chest discomfort that she describes as a cramp in her epigastric region and atypical in that it improves with massage of the area.  2D echo showed  moderately reduced LVF with EF 30-35% with diffuse HK and moderate BAE.  Trop is neg x 2 and BNP elevated at 249.  She tells me that she had a severe viral respiratory infection a month ago and is still recovering from it so this   Assessment & Plan    1. 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 Viral CM as she had a severe viral respiratory infection about a month ago vs. CAD - BNP 249.4 on admission - troponin x 2 negative (0.03 --> <0.03) - EKG with NSR with frequent ectopy - overall net negative 3L , no improvement in SOB yet  - weight on admission was 338 lbs, today she is 341 lbs - dry weight not yet determined - poorly controlled HTN likely contributing - She does not appear markedly volume overloaded today.  There is no LE edema and no appreciable JVD although difficult to assess due to obesity.  Can assess LVEDP at cath today.   -  Lasix increased to 40mg  IV BID yesterday  2.  Uncontrolled hypertension - now controlled - may be due to noncompliance with meds and sodium - continue ARB, Carvedilol and aldactone.  3.  Chest pain -  - atypical  - 2 day lexiscan myoview markedly abnormal with reduced counts throughout compared to resting images.  Unclear whether this is related to processing or actually represents ischemia.  Considered a high risk scan in setting of LV dysfunction.   - Cardiac catheterization  scheduled for 3pm today - continue IV Heparin gtt  4. nsVT - up to 10 beats, carvedilol increased to 6.25 mg po BID  5. AKI - sCr 1.09 yesteday (1.09 admit) - monitor kidney function on PO diuretics - if sCr continues to trend up any further, will back off on lasix  Plan- Cath today- will hold Cozaar and Aldactone today pre cath with history of renal insufficiency and low B/P this am.   Signed, Corine Shelter, PA-C  05/27/2016, 9:02 AM    The patient was seen, examined and discussed with Corine Shelter, PA-C and I agree with the above.   56 y.o. female with a PMH significant for HTN, DM, arthritis, and anxiety, morbid obesity, ongoing smoking, no cardiac history but has a family history of premature CAD with her sister dying in her 23's of an MI. The patient was admitted with new onset acute systolic CHF LVEF 30-35% with diffuse HK . Complaining of atypical chest pain, troponin is neg x 2 and BNP 249. She has had a recent severe viral respiratory infection a month ago and is still recovering from it.  Plan:  - right and left cardiac cath today, diff CAD vs myocarditis, if normal coronaries, we will plan for a cardiac MRI if she fits the scanner - she has had minimal diuresis on iv lasix, we will await LVEDP - she has had significant PVCs and nsVT up to 10 beats that has resolved after initiation of carvedilol, we will follow, if normal coronaries, we will start LifeVest until LVEF > 35%  Tobias Alexander, MD 05/27/2016

## 2016-05-28 ENCOUNTER — Encounter (HOSPITAL_COMMUNITY): Payer: Self-pay | Admitting: Interventional Cardiology

## 2016-05-28 DIAGNOSIS — I5043 Acute on chronic combined systolic (congestive) and diastolic (congestive) heart failure: Secondary | ICD-10-CM

## 2016-05-28 DIAGNOSIS — R7989 Other specified abnormal findings of blood chemistry: Secondary | ICD-10-CM

## 2016-05-28 LAB — BASIC METABOLIC PANEL
ANION GAP: 11 (ref 5–15)
BUN: 10 mg/dL (ref 6–20)
CHLORIDE: 97 mmol/L — AB (ref 101–111)
CO2: 27 mmol/L (ref 22–32)
Calcium: 9.3 mg/dL (ref 8.9–10.3)
Creatinine, Ser: 1.31 mg/dL — ABNORMAL HIGH (ref 0.44–1.00)
GFR calc Af Amer: 52 mL/min — ABNORMAL LOW (ref >60)
GFR calc non Af Amer: 45 mL/min — ABNORMAL LOW (ref >60)
Glucose, Bld: 138 mg/dL — ABNORMAL HIGH (ref 65–99)
POTASSIUM: 3.8 mmol/L (ref 3.5–5.1)
SODIUM: 135 mmol/L (ref 135–145)

## 2016-05-28 LAB — GLUCOSE, CAPILLARY
GLUCOSE-CAPILLARY: 137 mg/dL — AB (ref 65–99)
GLUCOSE-CAPILLARY: 125 mg/dL — AB (ref 65–99)
Glucose-Capillary: 117 mg/dL — ABNORMAL HIGH (ref 65–99)
Glucose-Capillary: 128 mg/dL — ABNORMAL HIGH (ref 65–99)

## 2016-05-28 MED ORDER — METOLAZONE 2.5 MG PO TABS
2.5000 mg | ORAL_TABLET | Freq: Every day | ORAL | Status: DC
  Administered 2016-05-28 – 2016-05-30 (×2): 2.5 mg via ORAL
  Filled 2016-05-28 (×4): qty 1

## 2016-05-28 MED ORDER — FUROSEMIDE 10 MG/ML IJ SOLN
80.0000 mg | Freq: Two times a day (BID) | INTRAMUSCULAR | Status: DC
  Administered 2016-05-28 – 2016-05-29 (×3): 80 mg via INTRAVENOUS
  Filled 2016-05-28 (×5): qty 8

## 2016-05-28 NOTE — Care Management Note (Addendum)
Case Management Note  Patient Details  Name: Amber Melendez MRN: [id number] Date of Birth: 03/06/1960  Subjective/Objective:       [facility] D/c plan home-otpt PT(already has script), dme [facility]-already following to deliver 3n1,rw to home.    5/9 [id number] Letha Capeeborah Alyana Kreiter RN, BSN - s/p heart cath.  NCM sent referral for outpatient physical therapy to [facility] in [place] thru epic.                             Action/Plan: NCM will follow for dc needs.  Expected Discharge Date:                  Expected Discharge Plan:  OP Rehab  In-House Referral:     Discharge planning Services  CM Consult  Post Acute Care Choice:    Choice offered to:     DME Arranged:  3-N-1, Walker rolling with seat DME Agency:  [facility]  HH Arranged:    HH Agency:     Status of Service:  Completed, signed off  If discussed at Long Length of Stay Meetings, dates discussed:    Additional Comments:  Leone Havenaylor, Madoline Bhatt Clinton, RN 05/28/2016, 8:35 AM

## 2016-05-28 NOTE — Progress Notes (Signed)
PROGRESS NOTE    Amber Melendez  ZOX:[id number] DOB: [birth year] DOA: 05/19/2016 PCP: Elpidio Eric, MD     Brief Narrative:  Amber Melendez is a 56 year old female with past medical history of hypertension, diabetes who presented to the emergency department with lower extremity edema, orthopnea and shortness of breath. She was admitted with findings of pulmonary edema and was found to have new diagnosis of CHF with low ejection fraction. Cardiology was consulted recommended nuclear stress tests which came high risk for CAD. She underwent heart cath on 5/8 with patent coronary arteries.   Assessment & Plan:   Principal Problem:   Elevated troponin Active Problems:   CHF exacerbation (HCC)   HTN (hypertension)   DM2 (diabetes mellitus, type 2) (HCC)   Acute exacerbation of CHF (congestive heart failure) (HCC)   SOB (shortness of breath)   Acute systolic CHF (congestive heart failure), NYHA class 3 (HCC)   DCM (dilated cardiomyopathy) (HCC)   Chest pain   Elevated brain natriuretic peptide (BNP) level   Acute combined systolic and diastolic congestive heart failure -Ddx of myocarditis, dilated cardiomyopathy  -EF 30-35% with diffuses hypokinesis  -Nuclear stress test came high risk for ischemia -Heart cath 5/8 with normal coronaries   -On BB, ARB, Aldactone and Lasix -Strict I&O's, fluid restriction, monitor daily weights -Lasix increased, metolazone per cardiology today -Cardiology following -Will need outpatient echo in 3 months   NSVT -Carvedilol dose increased, arrange for lifevest per cardiology  -Telemetry   Acute kidney injury  -Monitor BMP   Essential hypertension -Patient on beta blocker, ARB's, Aldactone and Lasix  Diabetes mellitus type 2 -A1c 7.5 - 05/22/16  -Novolog 70/30, SSI   GERD -Continue GI cocktail, Pepcid 40 mg BID  Tooth abscess  -Present prior to admission, was getting treatment with abx -Augmentin  -Needs follow up with a dentist as outpatient     Thyroid nodules x 2   -US showed Small bilateral solid isoechoic TR 3 nodules. These do not meet criteria for biopsy or follow-up. TSH normal   Tobacco abuse -Cessation discussed    DVT prophylaxis: lovenox Code Status: full Family Communication: no family at bedside Disposition Plan: pending improvement   Consultants:   Cardiology  Procedures:   Heart cath 5/8   Antimicrobials:   Augmentin 5/8 >>    Subjective: Patient very tearful today. She is very overwhelmed with her new diagnoses, shortness of breath. She feels that this is very sudden in terms of her declining health. Denies chest pain.   Objective: Vitals:   05/28/16 0503 05/28/16 0807 05/28/16 0926 05/28/16 1158  BP: (!) 162/68 (!) 148/61  (!) 138/55  Pulse: 92 (!) 44  73  Resp: (!) 22 (!) 22  (!) 22  Temp: 98.4 F (36.9 C) 98 F (36.7 C)  98.7 F (37.1 C)  TempSrc: Oral Oral  Oral  SpO2: 97% 98% 98%   Weight: (!) 152.5 kg (336 lb 3.2 oz)     Height:        Intake/Output Summary (Last 24 hours) at 05/28/16 1246 Last data filed at 05/28/16 0809  Gross per 24 hour  Intake             1440 ml  Output             2300 ml  Net             -860 ml   Filed Weights   05/26/16 0530 05/27/16 0618 05/28/16 0503  Weight: Marland Kitchen)  155.2 kg (342 lb 1.6 oz) (!) 154.7 kg (341 lb) (!) 152.5 kg (336 lb 3.2 oz)    Examination:  General exam: Appears calm and comfortable  Respiratory system: Clear to auscultation. Respiratory effort normal. Cardiovascular system: S1 & S2 heard, Irregular rhythm, rate normal. +trace pedal edema  Gastrointestinal system: Abdomen is nondistended, soft and nontender. No organomegaly or masses felt. Normal bowel sounds heard. Central nervous system: Alert and oriented. No focal neurological deficits. Extremities: Symmetric  Skin: No rashes, lesions or ulcers Psychiatry: Judgement and insight appear normal. Tearful today   Data Reviewed: I have personally reviewed following labs and  imaging studies  CBC:  Recent Labs Lab 05/22/16 0553 05/26/16 0529 05/27/16 0556 05/27/16 1739  WBC 10.4 9.9 10.8* 10.9*  NEUTROABS 6.2  --   --   --   HGB 13.5 13.6 12.9 12.7  HCT 41.0 41.8 40.4 39.9  MCV 82.5 83.3 83.5 83.0  PLT 285 261 267 250   Basic Metabolic Panel:  Recent Labs Lab 05/22/16 0553 05/23/16 0634 05/24/16 0611 05/26/16 0524 05/27/16 1739 05/28/16 0307  NA 138 137 137 135  --  135  K 3.5 3.9 3.9 3.7  --  3.8  CL 99* 101 101 99*  --  97*  CO2 27 26 26 27   --  27  GLUCOSE 130* 127* 136* 134*  --  138*  BUN 20 17 14 11   --  10  CREATININE 1.33* 1.25* 1.16* 1.09* 1.20* 1.31*  CALCIUM 9.0 9.0 9.1 9.0  --  9.3   GFR: Estimated Creatinine Clearance: 74.7 mL/min (A) (by C-G formula based on SCr of 1.31 mg/dL (H)). Liver Function Tests: No results for input(s): AST, ALT, ALKPHOS, BILITOT, PROT, ALBUMIN in the last 168 hours. No results for input(s): LIPASE, AMYLASE in the last 168 hours. No results for input(s): AMMONIA in the last 168 hours. Coagulation Profile:  Recent Labs Lab 05/27/16 1220  INR 0.99   Cardiac Enzymes: No results for input(s): CKTOTAL, CKMB, CKMBINDEX, TROPONINI in the last 168 hours. BNP (last 3 results) No results for input(s): PROBNP in the last 8760 hours. HbA1C: No results for input(s): HGBA1C in the last 72 hours. CBG:  Recent Labs Lab 05/27/16 1131 05/27/16 1719 05/27/16 2149 05/28/16 0635 05/28/16 1157  GLUCAP 118* 115* 163* 137* 125*   Lipid Profile: No results for input(s): CHOL, HDL, LDLCALC, TRIG, CHOLHDL, LDLDIRECT in the last 72 hours. Thyroid Function Tests:  Recent Labs  05/25/16 1612  TSH 2.721   Anemia Panel: No results for input(s): VITAMINB12, FOLATE, FERRITIN, TIBC, IRON, RETICCTPCT in the last 72 hours. Sepsis Labs: No results for input(s): PROCALCITON, LATICACIDVEN in the last 168 hours.  No results found for this or any previous visit (from the past 240 hour(s)).     Radiology  Studies: No results found.    Scheduled Meds: . amoxicillin-clavulanate  1 tablet Oral Q12H  . aspirin  81 mg Oral Daily  . carvedilol  6.25 mg Oral BID WC  . famotidine  40 mg Oral BID  . FLUoxetine  40 mg Oral Daily  . furosemide  80 mg Intravenous BID  . gabapentin  600 mg Oral Daily  . gabapentin  900 mg Oral QHS  . heparin  5,000 Units Subcutaneous Q8H  . insulin aspart  0-20 Units Subcutaneous TID WC  . insulin aspart protamine- aspart  35 Units Subcutaneous BID WC  . ipratropium-albuterol  3 mL Nebulization Q4H  . losartan  50 mg Oral  Daily  . metolazone  2.5 mg Oral Daily  . polyethylene glycol  17 g Oral Daily  . sodium chloride flush  3 mL Intravenous Q12H  . sodium chloride flush  3 mL Intravenous Q12H  . spironolactone  25 mg Oral Daily   Continuous Infusions: . sodium chloride    . sodium chloride       LOS: 7 days    Time spent: 40 minutes   Noralee StainJennifer Ajai Harville, DO [facility] [email or web address] Password [email or web address] 05/28/2016, 12:46 PM

## 2016-05-28 NOTE — Progress Notes (Signed)
Offered Pt a bath. Pt stated that she just wanted to sleep right now and will take care of bath later or when she gets home today. Tech gave Pt bath supplies.

## 2016-05-28 NOTE — Progress Notes (Signed)
Occupational Therapy Treatment Patient Details Name: Amber Melendez MRN: [id number] DOB: 12/12/1960 Today's Date: 05/28/2016    History of present illness Pt is a 56 y/o F with PMHx including HTN, DM2.  Patient presents to the ED with c/o 1 week history of LE swelling R>L, orthopnea, chest pressure, SOB worse when laying down, and DOE. Pt also reports low back pain, R shoulder pain   OT comments  OT treatment session today with focus on review of UE HEP. Pt reports that she was not able to perform theraband ex's as issued on 05/27/16 due to LE pain today. She also states that theraband was too difficult therefore, yellow/Level 1 theraband was issued (pt to resume use of Level 2/Orange at her tolerance). Reviewed and demonstrated bilateral UE shoulder FF (within pt tolerance); horizontal ABD; bilateral elbow flexion/extension. Pt verbalized understanding of HEP but declined attempting to perform during this session stating that she would attempt another time when her LE's were not as painful - rated LE pain as 9/10 this date. RN is aware and pt was pre-medicated prior to this therapy session per her report. Pt reports that she is Mod I ADL's and functional mobility for transfers, denies any self care needs at this time. Will sign off acute OT.   Follow Up Recommendations  Supervision - Intermittent    Equipment Recommendations  3 in 1 bedside commode (Wide)    Recommendations for Other Services      Precautions / Restrictions Precautions Precautions: None Restrictions Weight Bearing Restrictions: No       Mobility Bed Mobility                  Transfers                      Balance                                           ADL either performed or assessed with clinical judgement   ADL                                         General ADL Comments: Pt denies having any difficulty with transfers into bathroom or with ADl's. Explained to  pt that OT was present to review HEP using orange theraband (as issued previous date) within painfree range. Pt voiced that orange theraband was too difficult for her so OT provided her with Yellow or Level 1 theraband for HEP. Reviewed HEP - pt states that she cannot do them today secondry to having LE pain but was agreeable to watching OTR/L demonstrate ex's again for her. Another therapist issued written instructions on 05/27/16 and pt was instructed to use those. She verbalized understanding of this. Pt was also instructed to use yellow for exercies until they began to feel easier to perform and then switch to more difficult/Orange. She again verbalized understanding of this as did family member (?sister) whom was in room with her.     Vision  No change from baseline     Perception     Praxis      Cognition Arousal/Alertness: Awake/alert Behavior During Therapy: WFL for tasks assessed/performed;Flat affect Overall Cognitive Status: Within Functional Limits for tasks assessed  Exercises     Shoulder Instructions       General Comments OT treatment session today with focus on review of UE HEP. Pt reports that she was not able to perform theraband ex's as issued on 05/27/16 due to LE pain today. She also states that theraband was too difficult therefore, yellow/Level 1 theraband was issued (pt to resume use of Level 2/Orange at her tolerance). Reviewed and demonstrated bilateral UE shoulder FF (within pt tolerance); horizontal ABD; bilateral elbow flexion/extension. Pt verbalized understanding of HEP but declined attempting to perform during this session stating that she would attempt another time when her LE's were not as painful - rated LE pain as 9/10 this date. RN is aware and pt was pre-medicated prior to this therapy session per pt report.     Pertinent Vitals/ Pain       Pain Assessment: 0-10 Pain Score: 9  Pain Location:  Legs Pain Descriptors / Indicators: Aching Pain Intervention(s): Limited activity within patient's tolerance;Monitored during session;Premedicated before session  Home Living                                          Prior Functioning/Environment              Frequency  Min 2X/week        Progress Toward Goals  OT Goals(current goals can now be found in the care plan section)  Progress towards OT goals: Progressing toward goals  Acute Rehab OT Goals Patient Stated Goal: Continue to be independent with ADLs and functional mobility. Decreased pain in LE's  Plan Discharge plan remains appropriate    Co-evaluation                 AM-PAC PT "6 Clicks" Daily Activity     Outcome Measure   Help from another person eating meals?: None Help from another person taking care of personal grooming?: A Little Help from another person toileting, which includes using toliet, bedpan, or urinal?: A Little Help from another person bathing (including washing, rinsing, drying)?: A Little Help from another person to put on and taking off regular upper body clothing?: A Little Help from another person to put on and taking off regular lower body clothing?: A Little 6 Click Score: 19    End of Session    OT Visit Diagnosis: Muscle weakness (generalized) (M62.81);Pain Pain - Right/Left:  (Bilateral LE's) Pain - part of body: Leg (Bilateral Legs)   Activity Tolerance Patient limited by pain   Patient Left in bed;with call bell/phone within reach;with family/visitor present   Nurse Communication      Functional Assessment Tool Used: AM-PAC 6 Clicks Daily Activity;Clinical judgement Functional Limitation: Self care Self Care Current Status ([id number]): At least 40 percent but less than 60 percent impaired, limited or restricted Self Care Goal Status ([id number]([id number]): At least 20 percent but less than 40 percent impaired, limited or restricted Self Care Discharge Status  [phone]): At least 20 percent but less than 40 percent impaired, limited or restricted   Time: 1050-1102 OT Time Calculation (min): 12 min  Charges: OT G-codes **NOT FOR INPATIENT CLASS** Functional Assessment Tool Used: AM-PAC 6 Clicks Daily Activity;Clinical judgement Functional Limitation: Self care Self Care Current Status ([id number]): At least 40 percent but less than 60 percent impaired, limited or restricted Self Care Goal Status ([id number]): At least 20 percent but less than 40  percent impaired, limited or restricted Self Care Discharge Status [phone]): At least 20 percent but less than 40 percent impaired, limited or restricted OT General Charges $OT Visit: 1 Procedure OT Treatments $Therapeutic Activity: 8-22 mins  Cattleya Dobratz, OTR/L 05/28/16 11:39 AM     Amiayah Giebel Beth Dixon 05/28/2016, 11:36 AM

## 2016-05-28 NOTE — Progress Notes (Signed)
Progress Note  Patient Name: Amber Melendez Date of Encounter: 05/28/2016  Primary Cardiologist: Dr. Mayford Knife  Subjective   She feels SOB, no chest pain.  Inpatient Medications    Scheduled Meds: . amoxicillin-clavulanate  1 tablet Oral Q12H  . aspirin  81 mg Oral Daily  . carvedilol  6.25 mg Oral BID WC  . famotidine  40 mg Oral BID  . FLUoxetine  40 mg Oral Daily  . furosemide  40 mg Intravenous BID  . gabapentin  600 mg Oral Daily  . gabapentin  900 mg Oral QHS  . heparin  5,000 Units Subcutaneous Q8H  . insulin aspart  0-20 Units Subcutaneous TID WC  . insulin aspart protamine- aspart  35 Units Subcutaneous BID WC  . ipratropium-albuterol  3 mL Nebulization Q4H  . losartan  50 mg Oral Daily  . polyethylene glycol  17 g Oral Daily  . sodium chloride flush  3 mL Intravenous Q12H  . sodium chloride flush  3 mL Intravenous Q12H  . spironolactone  25 mg Oral Daily   Continuous Infusions: . sodium chloride    . sodium chloride     PRN Meds: sodium chloride, sodium chloride, acetaminophen, albuterol, diphenhydrAMINE, gi cocktail, magic mouthwash w/lidocaine, menthol-cetylpyridinium, nicotine polacrilex, ondansetron (ZOFRAN) IV, oxycodone, oxyCODONE-acetaminophen, sodium chloride flush, sodium chloride flush   Vital Signs    Vitals:   05/28/16 0018 05/28/16 0503 05/28/16 0807 05/28/16 0926  BP:  (!) 162/68 (!) 148/61   Pulse:  92 (!) 44   Resp:  (!) 22 (!) 22   Temp:  98.4 F (36.9 C) 98 F (36.7 C)   TempSrc:  Oral Oral   SpO2: 96% 97% 98% 98%  Weight:  (!) 336 lb 3.2 oz (152.5 kg)    Height:        Intake/Output Summary (Last 24 hours) at 05/28/16 0955 Last data filed at 05/28/16 0809  Gross per 24 hour  Intake          1524.47 ml  Output             2700 ml  Net         -1175.53 ml   Filed Weights   05/26/16 0530 05/27/16 0618 05/28/16 0503  Weight: (!) 342 lb 1.6 oz (155.2 kg) (!) 341 lb (154.7 kg) (!) 336 lb 3.2 oz (152.5 kg)    Telemetry    NSR-  Personally Reviewed  ECG    No new EKG - Personally Reviewed  Physical Exam   GEN: WD, WN in NAD Neck: No JVD or bruit Cardiac: RRR with no M/R/G.  Respiratory: wheezing B/L GI: soft, NT, ND with active BS MS: no edema or deformity Neuro:  A&O x 3 Psych: normal affect  Labs    Chemistry  Recent Labs Lab 05/24/16 0611 05/26/16 0524 05/27/16 1739 05/28/16 0307  NA 137 135  --  135  K 3.9 3.7  --  3.8  CL 101 99*  --  97*  CO2 26 27  --  27  GLUCOSE 136* 134*  --  138*  BUN 14 11  --  10  CREATININE 1.16* 1.09* 1.20* 1.31*  CALCIUM 9.1 9.0  --  9.3  GFRNONAA 52* 56* 50* 45*  GFRAA 60* >60 57* 52*  ANIONGAP 10 9  --  11     Hematology  Recent Labs Lab 05/26/16 0529 05/27/16 0556 05/27/16 1739  WBC 9.9 10.8* 10.9*  RBC 5.02 4.84 4.81  HGB 13.6  12.9 12.7  HCT 41.8 40.4 39.9  MCV 83.3 83.5 83.0  MCH 27.1 26.7 26.4  MCHC 32.5 31.9 31.8  RDW 16.0* 16.1* 15.8*  PLT 261 267 250    Cardiac Enzymes No results for input(s): TROPONINI in the last 168 hours. No results for input(s): TROPIPOC in the last 168 hours.   BNP  Recent Labs Lab 05/23/16 0634  BNP 120.3*     DDimer No results for input(s): DDIMER in the last 168 hours.   Radiology    US Abdomen Complete  Result Date: 05/23/2016 CLINICAL DATA:  Abdominal pain. EXAM: ABDOMEN ULTRASOUND COMPLETE COMPARISON:  None. FINDINGS: Gallbladder: No gallstones or wall thickening visualized. No sonographic Murphy sign noted by sonographer. Common bile duct: Diameter: 3.7 mm Liver: Diffuse increased echogenicity with no suspicious masses. A 2.9 cm cyst is seen in the right hepatic lobe. IVC: No abnormality visualized. Pancreas: Visualized portion unremarkable. Spleen: Size and appearance within normal limits. Right Kidney: Length: 11.2 cm. Echogenicity within normal limits. No mass or hydronephrosis visualized. Left Kidney: Length: 11.5 cm. Echogenicity within normal limits. No mass or hydronephrosis visualized.  Abdominal aorta: No aneurysm visualized. Other findings: None. IMPRESSION: 1. Probable hepatic steatosis. No other abnormalities identified. The study was limited by patient body habitus and shadowing bowel gas. Electronically Signed   By: Gerome Sam III M.D   On: 05/23/2016 21:34   Nm Myocar Multi W/spect Amber Melendez Motion / Ef  Addendum Date: 05/24/2016    There was no ST segment deviation noted during stress.  No T wave inversion was noted during stress.  There is a large defect of severe severity present in the basal anterior, basal anteroseptal, basal inferoseptal, basal inferior, basal inferolateral, basal anterolateral, mid anterior, mid anteroseptal, mid inferoseptal, mid inferior, mid inferolateral, mid anterolateral, apical anterior, apical inferior, apical lateral and apex location. The defect is reversible and consistent with ischemia. This is a very poor quality study and counts are diffusely low in all areas on stress images.  The left ventricular ejection fraction is mildly decreased (45-54%).  Nuclear stress EF: 45%.  Unclear whether this represents true ischemia in all regions or is due to poor image quality in processing on stress imaging. In setting of LV dysfunction, recommend cardiac cath for further evaluation for CAD  Findings consistent with ischemia.  This is a high risk study.     Cardiac Studies   05/20/2016 2D echo Study Conclusions  - Left ventricle: The cavity size was normal. Wall thickness was   normal. Systolic function was moderately to severely reduced. The   estimated ejection fraction was in the range of 30% to 35%.   Diffuse hypokinesis. Features are consistent with a pseudonormal   left ventricular filling pattern, with concomitant abnormal   relaxation and increased filling pressure (grade 2 diastolic   dysfunction). - Left atrium: The atrium was moderately dilated. - Right atrium: The atrium was mildly dilated.  Impressions:  - Moderate to  severe global reduction in LV systolic function;   moderate diastolic dysfunction; biatrial enlargement.  Telemetry: SR, nsVT up to 10 beats, polymorphic, possibly ischemic    Patient Profile     56 y.o. female  with a PMH significant for HTN, DM, arthritis, and anxiety.  She has no cardiac history but has a family history of premature CAD with her sister dying in her 52's of an MI.  She also smokes 1/2 ppd of cigarettes.  She presented to the ER with complaints of LE edema,  orthopnea and DOE and CXRAY showed pulmonary edema.  She also complained of chest discomfort that she describes as a cramp in her epigastric region and atypical in that it improves with massage of the area.  2D echo showed moderately reduced LVF with EF 30-35% with diffuse HK and moderate BAE.  Trop is neg x 2 and BNP elevated at 249.  She tells me that she had a severe viral respiratory infection a month ago and is still recovering from it so this   Assessment & Plan    1. 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 Viral CM as she had a severe viral respiratory infection about a month ago vs. CAD - BNP 249.4 on admission - troponin x 2 negative (0.03 --> <0.03) - EKG with NSR with frequent ectopy - overall net negative 3L , no improvement in SOB yet  - weight on admission was 338 lbs, today she is 341 lbs - dry weight not yet determined - poorly controlled HTN likely contributing - She does not appear markedly volume overloaded today.  There is no LE edema and no appreciable JVD although difficult to assess due to obesity.  Can assess LVEDP at cath today.   -  Lasix increased to 40mg  IV BID yesterday  2.  Uncontrolled hypertension - now controlled - may be due to noncompliance with meds and sodium - continue ARB, Carvedilol and aldactone.  3.  Chest pain -  - atypical  - 2 day lexiscan myoview markedly abnormal with reduced counts throughout compared to resting images.  Unclear  whether this is related to processing or actually represents ischemia.  Considered a high risk scan in setting of LV dysfunction.   - Cardiac catheterization scheduled for 3pm today - continue IV Heparin gtt  4. nsVT - up to 10 beats, carvedilol increased to 6.25 mg po BID  5. AKI - sCr 1.09 yesteday (1.09 admit) - monitor kidney function on PO diuretics - if sCr continues to trend up any further, will back off on lasix  56 y.o. female with a PMH significant for HTN, DM, arthritis, and anxiety, morbid obesity, ongoing smoking, no cardiac history but has a family history of premature CAD with her sister dying in her 7040's of an MI. The patient was admitted with new onset acute systolic CHF LVEF 30-35% with diffuse HK . Complaining of atypical chest pain, troponin is neg x 2 and BNP 249. She has had a recent severe viral respiratory infection a month ago and is still recovering from it.  Plan:  -normal coronaries, diff dg myocarditis vs dilated CMP, started on carvedilol and losartan,  - I will increase Lasix to 80 mg iv BID and metolazone 2.5 mg po daily, anticipated discharge tomorrow. - improved nsVT with increased carvedilol - arrange for Life vest -increase losartan to 100 mg po daily - outpatient repeat echo in 3 months   Tobias AlexanderKatarina Levern Pitter, MD 05/28/2016

## 2016-05-29 LAB — BASIC METABOLIC PANEL
Anion gap: 13 (ref 5–15)
BUN: 14 mg/dL (ref 6–20)
CHLORIDE: 93 mmol/L — AB (ref 101–111)
CO2: 28 mmol/L (ref 22–32)
Calcium: 9.6 mg/dL (ref 8.9–10.3)
Creatinine, Ser: 1.42 mg/dL — ABNORMAL HIGH (ref 0.44–1.00)
GFR calc Af Amer: 47 mL/min — ABNORMAL LOW (ref >60)
GFR calc non Af Amer: 40 mL/min — ABNORMAL LOW (ref >60)
Glucose, Bld: 122 mg/dL — ABNORMAL HIGH (ref 65–99)
POTASSIUM: 3.7 mmol/L (ref 3.5–5.1)
Sodium: 134 mmol/L — ABNORMAL LOW (ref 135–145)

## 2016-05-29 LAB — GLUCOSE, CAPILLARY
GLUCOSE-CAPILLARY: 125 mg/dL — AB (ref 65–99)
GLUCOSE-CAPILLARY: 127 mg/dL — AB (ref 65–99)
Glucose-Capillary: 129 mg/dL — ABNORMAL HIGH (ref 65–99)
Glucose-Capillary: 133 mg/dL — ABNORMAL HIGH (ref 65–99)

## 2016-05-29 LAB — CBC
HEMATOCRIT: 40.4 % (ref 36.0–46.0)
Hemoglobin: 13.1 g/dL (ref 12.0–15.0)
MCH: 26.6 pg (ref 26.0–34.0)
MCHC: 32.4 g/dL (ref 30.0–36.0)
MCV: 82.1 fL (ref 78.0–100.0)
PLATELETS: 247 10*3/uL (ref 150–400)
RBC: 4.92 MIL/uL (ref 3.87–5.11)
RDW: 15.7 % — AB (ref 11.5–15.5)
WBC: 12.8 10*3/uL — ABNORMAL HIGH (ref 4.0–10.5)

## 2016-05-29 MED ORDER — HYDRALAZINE HCL 25 MG PO TABS
25.0000 mg | ORAL_TABLET | Freq: Three times a day (TID) | ORAL | Status: DC
  Administered 2016-05-29 – 2016-05-30 (×4): 25 mg via ORAL
  Filled 2016-05-29 (×4): qty 1

## 2016-05-29 MED ORDER — IPRATROPIUM-ALBUTEROL 0.5-2.5 (3) MG/3ML IN SOLN
3.0000 mL | RESPIRATORY_TRACT | Status: DC | PRN
  Administered 2016-05-30: 03:00:00 3 mL via RESPIRATORY_TRACT
  Filled 2016-05-29: qty 3

## 2016-05-29 MED ORDER — LOSARTAN POTASSIUM 50 MG PO TABS
25.0000 mg | ORAL_TABLET | Freq: Every day | ORAL | Status: DC
  Administered 2016-05-29: 25 mg via ORAL
  Filled 2016-05-29: qty 1

## 2016-05-29 MED ORDER — GUAIFENESIN-DM 100-10 MG/5ML PO SYRP
5.0000 mL | ORAL_SOLUTION | ORAL | Status: DC | PRN
  Administered 2016-05-29: 11:00:00 5 mL via ORAL
  Filled 2016-05-29: qty 5

## 2016-05-29 MED ORDER — IPRATROPIUM-ALBUTEROL 0.5-2.5 (3) MG/3ML IN SOLN
3.0000 mL | Freq: Four times a day (QID) | RESPIRATORY_TRACT | Status: DC
  Administered 2016-05-29 (×2): 3 mL via RESPIRATORY_TRACT
  Filled 2016-05-29 (×2): qty 3

## 2016-05-29 MED ORDER — CARVEDILOL 12.5 MG PO TABS
12.5000 mg | ORAL_TABLET | Freq: Two times a day (BID) | ORAL | Status: DC
  Administered 2016-05-29 – 2016-05-30 (×2): 12.5 mg via ORAL
  Filled 2016-05-29 (×2): qty 1

## 2016-05-29 NOTE — Progress Notes (Signed)
PROGRESS NOTE    Amber Melendez  ONG:[id number] DOB: 1960/03/05 DOA: 05/19/2016 PCP: Elpidio Eric, MD     Brief Narrative:  Amber Melendez is a 56 year old female with past medical history of hypertension, diabetes who presented to the emergency department with lower extremity edema, orthopnea and shortness of breath. She was admitted with findings of pulmonary edema and was found to have new diagnosis of CHF with low ejection fraction. Cardiology was consulted recommended nuclear stress tests which came high risk for CAD. She underwent heart cath on 5/8 with patent coronary arteries.   Assessment & Plan:   Principal Problem:   Elevated troponin Active Problems:   CHF exacerbation (HCC)   HTN (hypertension)   DM2 (diabetes mellitus, type 2) (HCC)   Acute exacerbation of CHF (congestive heart failure) (HCC)   SOB (shortness of breath)   Acute systolic CHF (congestive heart failure), NYHA class 3 (HCC)   DCM (dilated cardiomyopathy) (HCC)   Chest pain   Elevated brain natriuretic peptide (BNP) level   Acute combined systolic and diastolic congestive heart failure -Ddx of myocarditis, dilated cardiomyopathy  -EF 30-35% with diffuses hypokinesis  -Nuclear stress test came high risk for ischemia -Heart cath 5/8 with normal coronaries   -On BB, ARB, Aldactone and Lasix -Strict I&O's, fluid restriction, monitor daily weights -Lasix increased, metolazone per cardiology  -Cardiology following -Will need outpatient echo in 3 months   NSVT -Carvedilol dose increased, arrange for lifevest per cardiology  -Telemetry   Acute kidney injury  -Due to contrast, diuretics, ARB  -Monitor BMP   Essential hypertension -Continue beta blocker, ARB's, Aldactone and Lasix  Diabetes mellitus type 2 -A1c 7.5 - 05/22/16  -Novolog 70/30, SSI   GERD -Continue GI cocktail, Pepcid 40 mg BID  Tooth abscess  -Present prior to admission, was getting treatment with abx -Augmentin  -Needs follow up  with a dentist as outpatient   Thyroid nodules x 2   -US showed Small bilateral solid isoechoic TR 3 nodules. These do not meet criteria for biopsy or follow-up. TSH normal   Tobacco abuse -Cessation discussed   Viral laryngitis -Complaining of sore throat. Likely viral in etiology. Supportive care   DVT prophylaxis: lovenox Code Status: full Family Communication: no family at bedside Disposition Plan: pending improvement   Consultants:   Cardiology  Procedures:   Heart cath 5/8   Antimicrobials:   Augmentin 5/8 >>    Subjective: Doing mildly better today. She states that she has been urinating a lot. Denies any chest pain. Shortness of breath seems better. Peripheral edema has now resolved.  Objective: Vitals:   05/29/16 1330 05/29/16 1400 05/29/16 1415 05/29/16 1418  BP: (!) 161/118 (!) 134/47 (!) 157/51 (!) 157/51  Pulse:    75  Resp: (!) 27 16 19  (!) 27  Temp:    97.9 F (36.6 C)  TempSrc:    Oral  SpO2:    100%  Weight:      Height:        Intake/Output Summary (Last 24 hours) at 05/29/16 1451 Last data filed at 05/29/16 1300  Gross per 24 hour  Intake             1560 ml  Output             2800 ml  Net            -1240 ml   Filed Weights   05/27/16 0618 05/28/16 0503 05/29/16 0628  Weight: (!) 154.7  kg (341 lb) (!) 152.5 kg (336 lb 3.2 oz) (!) 151.3 kg (333 lb 8.9 oz)    Examination:  General exam: Appears calm and comfortable  Respiratory system: Clear to auscultation. Respiratory effort normal. Cardiovascular system: S1 & S2 heard, Irregular rhythm, rate normal. no pedal edema  Gastrointestinal system: Abdomen is nondistended, soft and nontender. No organomegaly or masses felt. Normal bowel sounds heard. Central nervous system: Alert and oriented. No focal neurological deficits. Extremities: Symmetric  Skin: No rashes, lesions or ulcers Psychiatry: Judgement and insight appear normal.   Data Reviewed: I have personally reviewed following  labs and imaging studies  CBC:  Recent Labs Lab 05/26/16 0529 05/27/16 0556 05/27/16 1739 05/29/16 0256  WBC 9.9 10.8* 10.9* 12.8*  HGB 13.6 12.9 12.7 13.1  HCT 41.8 40.4 39.9 40.4  MCV 83.3 83.5 83.0 82.1  PLT 261 267 250 247   Basic Metabolic Panel:  Recent Labs Lab 05/23/16 0634 05/24/16 0611 05/26/16 0524 05/27/16 1739 05/28/16 0307 05/29/16 0256  NA 137 137 135  --  135 134*  K 3.9 3.9 3.7  --  3.8 3.7  CL 101 101 99*  --  97* 93*  CO2 26 26 27   --  27 28  GLUCOSE 127* 136* 134*  --  138* 122*  BUN 17 14 11   --  10 14  CREATININE 1.25* 1.16* 1.09* 1.20* 1.31* 1.42*  CALCIUM 9.0 9.1 9.0  --  9.3 9.6   GFR: Estimated Creatinine Clearance: 68.6 mL/min (A) (by C-G formula based on SCr of 1.42 mg/dL (H)). Liver Function Tests: No results for input(s): AST, ALT, ALKPHOS, BILITOT, PROT, ALBUMIN in the last 168 hours. No results for input(s): LIPASE, AMYLASE in the last 168 hours. No results for input(s): AMMONIA in the last 168 hours. Coagulation Profile:  Recent Labs Lab 05/27/16 1220  INR 0.99   Cardiac Enzymes: No results for input(s): CKTOTAL, CKMB, CKMBINDEX, TROPONINI in the last 168 hours. BNP (last 3 results) No results for input(s): PROBNP in the last 8760 hours. HbA1C: No results for input(s): HGBA1C in the last 72 hours. CBG:  Recent Labs Lab 05/28/16 1157 05/28/16 1758 05/28/16 2208 05/29/16 0631 05/29/16 1218  GLUCAP 125* 117* 128* 125* 129*   Lipid Profile: No results for input(s): CHOL, HDL, LDLCALC, TRIG, CHOLHDL, LDLDIRECT in the last 72 hours. Thyroid Function Tests: No results for input(s): TSH, T4TOTAL, FREET4, T3FREE, THYROIDAB in the last 72 hours. Anemia Panel: No results for input(s): VITAMINB12, FOLATE, FERRITIN, TIBC, IRON, RETICCTPCT in the last 72 hours. Sepsis Labs: No results for input(s): PROCALCITON, LATICACIDVEN in the last 168 hours.  No results found for this or any previous visit (from the past 240 hour(s)).       Radiology Studies: No results found.    Scheduled Meds: . amoxicillin-clavulanate  1 tablet Oral Q12H  . aspirin  81 mg Oral Daily  . carvedilol  12.5 mg Oral BID WC  . famotidine  40 mg Oral BID  . FLUoxetine  40 mg Oral Daily  . furosemide  80 mg Intravenous BID  . gabapentin  600 mg Oral Daily  . gabapentin  900 mg Oral QHS  . heparin  5,000 Units Subcutaneous Q8H  . hydrALAZINE  25 mg Oral TID  . insulin aspart  0-20 Units Subcutaneous TID WC  . insulin aspart protamine- aspart  35 Units Subcutaneous BID WC  . losartan  25 mg Oral Daily  . metolazone  2.5 mg Oral Daily  .  polyethylene glycol  17 g Oral Daily  . sodium chloride flush  3 mL Intravenous Q12H  . sodium chloride flush  3 mL Intravenous Q12H  . spironolactone  25 mg Oral Daily   Continuous Infusions: . sodium chloride    . sodium chloride       LOS: 8 days    Time spent: 30 minutes   Noralee Stain, DO [facility] [email or web address] Password [email or web address] 05/29/2016, 2:51 PM

## 2016-05-29 NOTE — Progress Notes (Signed)
Progress Note  Patient Name: Channon Brougher Date of Encounter: 05/29/2016  Primary Cardiologist: Dr. Mayford Knife  Subjective   She feels minimally better today.SOB has improved.  Inpatient Medications    Scheduled Meds: . amoxicillin-clavulanate  1 tablet Oral Q12H  . aspirin  81 mg Oral Daily  . carvedilol  6.25 mg Oral BID WC  . famotidine  40 mg Oral BID  . FLUoxetine  40 mg Oral Daily  . furosemide  80 mg Intravenous BID  . gabapentin  600 mg Oral Daily  . gabapentin  900 mg Oral QHS  . heparin  5,000 Units Subcutaneous Q8H  . insulin aspart  0-20 Units Subcutaneous TID WC  . insulin aspart protamine- aspart  35 Units Subcutaneous BID WC  . ipratropium-albuterol  3 mL Nebulization QID  . losartan  50 mg Oral Daily  . metolazone  2.5 mg Oral Daily  . polyethylene glycol  17 g Oral Daily  . sodium chloride flush  3 mL Intravenous Q12H  . sodium chloride flush  3 mL Intravenous Q12H  . spironolactone  25 mg Oral Daily   Continuous Infusions: . sodium chloride    . sodium chloride     PRN Meds: sodium chloride, sodium chloride, acetaminophen, albuterol, diphenhydrAMINE, gi cocktail, magic mouthwash w/lidocaine, menthol-cetylpyridinium, nicotine polacrilex, ondansetron (ZOFRAN) IV, oxycodone, oxyCODONE-acetaminophen, sodium chloride flush, sodium chloride flush   Vital Signs    Vitals:   05/28/16 2013 05/28/16 2319 05/29/16 0329 05/29/16 0628  BP:    (!) 165/79  Pulse:    78  Resp:    18  Temp:    98 F (36.7 C)  TempSrc:    Oral  SpO2: 94% 96% 94% 94%  Weight:    (!) 333 lb 8.9 oz (151.3 kg)  Height:        Intake/Output Summary (Last 24 hours) at 05/29/16 0821 Last data filed at 05/29/16 1610  Gross per 24 hour  Intake              720 ml  Output             2900 ml  Net            -2180 ml   Filed Weights   05/27/16 0618 05/28/16 0503 05/29/16 0628  Weight: (!) 341 lb (154.7 kg) (!) 336 lb 3.2 oz (152.5 kg) (!) 333 lb 8.9 oz (151.3 kg)    Telemetry      NSR- Personally Reviewed  ECG    No new EKG - Personally Reviewed  Physical Exam   GEN: WD, WN in NAD Neck: No JVD or bruit Cardiac: RRR with no M/R/G.  Respiratory: wheezing B/L GI: soft, NT, ND with active BS MS: no edema or deformity Neuro:  A&O x 3 Psych: normal affect  Labs    Chemistry  Recent Labs Lab 05/26/16 0524 05/27/16 1739 05/28/16 0307 05/29/16 0256  NA 135  --  135 134*  K 3.7  --  3.8 3.7  CL 99*  --  97* 93*  CO2 27  --  27 28  GLUCOSE 134*  --  138* 122*  BUN 11  --  10 14  CREATININE 1.09* 1.20* 1.31* 1.42*  CALCIUM 9.0  --  9.3 9.6  GFRNONAA 56* 50* 45* 40*  GFRAA >60 57* 52* 47*  ANIONGAP 9  --  11 13     Hematology  Recent Labs Lab 05/27/16 0556 05/27/16 1739 05/29/16 0256  WBC 10.8*  10.9* 12.8*  RBC 4.84 4.81 4.92  HGB 12.9 12.7 13.1  HCT 40.4 39.9 40.4  MCV 83.5 83.0 82.1  MCH 26.7 26.4 26.6  MCHC 31.9 31.8 32.4  RDW 16.1* 15.8* 15.7*  PLT 267 250 247    Cardiac Enzymes No results for input(s): TROPONINI in the last 168 hours. No results for input(s): TROPIPOC in the last 168 hours.   BNP  Recent Labs Lab 05/23/16 0634  BNP 120.3*     DDimer No results for input(s): DDIMER in the last 168 hours.   Radiology    [place] Abdomen Complete  Result Date: 05/23/2016 CLINICAL DATA:  Abdominal pain. EXAM: ABDOMEN ULTRASOUND COMPLETE COMPARISON:  None. FINDINGS: Gallbladder: No gallstones or wall thickening visualized. No sonographic Murphy sign noted by sonographer. Common bile duct: Diameter: 3.7 mm Liver: Diffuse increased echogenicity with no suspicious masses. A 2.9 cm cyst is seen in the right hepatic lobe. IVC: No abnormality visualized. Pancreas: Visualized portion unremarkable. Spleen: Size and appearance within normal limits. Right Kidney: Length: 11.2 cm. Echogenicity within normal limits. No mass or hydronephrosis visualized. Left Kidney: Length: 11.5 cm. Echogenicity within normal limits. No mass or hydronephrosis  visualized. Abdominal aorta: No aneurysm visualized. Other findings: None. IMPRESSION: 1. Probable hepatic steatosis. No other abnormalities identified. The study was limited by patient body habitus and shadowing bowel gas. Electronically Signed   By: Gerome Samavid  Williams III M.D   On: 05/23/2016 21:34   Nm Myocar Multi W/spect Izetta DakinW/wall Motion / Ef  Addendum Date: 05/24/2016    There was no ST segment deviation noted during stress.  No T wave inversion was noted during stress.  There is a large defect of severe severity present in the basal anterior, basal anteroseptal, basal inferoseptal, basal inferior, basal inferolateral, basal anterolateral, mid anterior, mid anteroseptal, mid inferoseptal, mid inferior, mid inferolateral, mid anterolateral, apical anterior, apical inferior, apical lateral and apex location. The defect is reversible and consistent with ischemia. This is a very poor quality study and counts are diffusely low in all areas on stress images.  The left ventricular ejection fraction is mildly decreased (45-54%).  Nuclear stress EF: 45%.  Unclear whether this represents true ischemia in all regions or is due to poor image quality in processing on stress imaging. In setting of LV dysfunction, recommend cardiac cath for further evaluation for CAD  Findings consistent with ischemia.  This is a high risk study.     Cardiac Studies   05/20/2016 2D echo Study Conclusions  - Left ventricle: The cavity size was normal. Wall thickness was   normal. Systolic function was moderately to severely reduced. The   estimated ejection fraction was in the range of 30% to 35%.   Diffuse hypokinesis. Features are consistent with a pseudonormal   left ventricular filling pattern, with concomitant abnormal   relaxation and increased filling pressure (grade 2 diastolic   dysfunction). - Left atrium: The atrium was moderately dilated. - Right atrium: The atrium was mildly dilated.  Impressions:  -  Moderate to severe global reduction in LV systolic function;   moderate diastolic dysfunction; biatrial enlargement.  Telemetry: SR, nsVT up to 10 beats, polymorphic, possibly ischemic    Patient Profile     56 y.o. female  with a PMH significant for HTN, DM, arthritis, and anxiety.  She has no cardiac history but has a family history of premature CAD with her sister dying in her 4640's of an MI.  She also smokes 1/2 ppd of cigarettes.  She presented to the ER with complaints of LE edema, orthopnea and DOE and CXRAY showed pulmonary edema.  She also complained of chest discomfort that she describes as a cramp in her epigastric region and atypical in that it improves with massage of the area.  2D echo showed moderately reduced LVF with EF 30-35% with diffuse HK and moderate BAE.  Trop is neg x 2 and BNP elevated at 249.  She tells me that she had a severe viral respiratory infection a month ago and is still recovering from it so this   Assessment & Plan    1. 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 Viral CM as she had a severe viral respiratory infection about a month ago vs. CAD - BNP 249.4 on admission - troponin x 2 negative (0.03 --> <0.03) - EKG with NSR with frequent ectopy - overall net negative 3L , no improvement in SOB yet  - weight on admission was 338 lbs, today she is 341 lbs - dry weight not yet determined - poorly controlled HTN likely contributing - She does not appear markedly volume overloaded today.  There is no LE edema and no appreciable JVD although difficult to assess due to obesity.  Can assess LVEDP at cath today.   -  Lasix increased to 40mg  IV BID yesterday  2.  Uncontrolled hypertension - now controlled - may be due to noncompliance with meds and sodium - continue ARB, Carvedilol and aldactone.  3.  Chest pain -  - atypical  - 2 day lexiscan myoview markedly abnormal with reduced counts throughout compared to resting  images.  Unclear whether this is related to processing or actually represents ischemia.  Considered a high risk scan in setting of LV dysfunction.   - Cardiac catheterization scheduled for 3pm today - continue IV Heparin gtt  4. nsVT - up to 10 beats, carvedilol increased to 6.25 mg po BID  5. AKI - sCr 1.09 yesteday (1.09 admit) - monitor kidney function on PO diuretics - if sCr continues to trend up any further, will back off on lasix  56 y.o. female with a PMH significant for HTN, DM, arthritis, and anxiety, morbid obesity, ongoing smoking, no cardiac history but has a family history of premature CAD with her sister dying in her 72's of an MI. The patient was admitted with new onset acute systolic CHF LVEF 30-35% with diffuse HK . Complaining of atypical chest pain, troponin is neg x 2 and BNP 249. She has had a recent severe viral respiratory infection a month ago and is still recovering from it.  Plan:  -normal coronaries, diff dg myocarditis vs dilated CMP, started on carvedilol and losartan,  - I will continue Lasix to 80 mg iv BID and metolazone 2.5 mg po daily, anticipated discharge tomorrow. - improved nsVT with increased carvedilol, still hypertensive, I would increase coreq to 12.5 mg po BID. - elevated crea, decrease losartan to 25 mg po daily and start hydralazine 25 mg po TID, if tolerate add imdur later.  - arrange for Life vest - outpatient repeat echo in 3 months  Tobias Alexander, MD 05/29/2016

## 2016-05-30 DIAGNOSIS — I5041 Acute combined systolic (congestive) and diastolic (congestive) heart failure: Secondary | ICD-10-CM

## 2016-05-30 DIAGNOSIS — I110 Hypertensive heart disease with heart failure: Principal | ICD-10-CM

## 2016-05-30 LAB — BASIC METABOLIC PANEL
Anion gap: 14 (ref 5–15)
BUN: 21 mg/dL — AB (ref 6–20)
CALCIUM: 9.4 mg/dL (ref 8.9–10.3)
CHLORIDE: 89 mmol/L — AB (ref 101–111)
CO2: 28 mmol/L (ref 22–32)
CREATININE: 1.57 mg/dL — AB (ref 0.44–1.00)
GFR calc Af Amer: 41 mL/min — ABNORMAL LOW (ref >60)
GFR, EST NON AFRICAN AMERICAN: 36 mL/min — AB (ref >60)
Glucose, Bld: 126 mg/dL — ABNORMAL HIGH (ref 65–99)
Potassium: 3.5 mmol/L (ref 3.5–5.1)
SODIUM: 131 mmol/L — AB (ref 135–145)

## 2016-05-30 LAB — GLUCOSE, CAPILLARY
GLUCOSE-CAPILLARY: 147 mg/dL — AB (ref 65–99)
Glucose-Capillary: 105 mg/dL — ABNORMAL HIGH (ref 65–99)

## 2016-05-30 LAB — CBC
HCT: 41.2 % (ref 36.0–46.0)
Hemoglobin: 13.4 g/dL (ref 12.0–15.0)
MCH: 26.5 pg (ref 26.0–34.0)
MCHC: 32.5 g/dL (ref 30.0–36.0)
MCV: 81.4 fL (ref 78.0–100.0)
PLATELETS: 280 10*3/uL (ref 150–400)
RBC: 5.06 MIL/uL (ref 3.87–5.11)
RDW: 15.8 % — AB (ref 11.5–15.5)
WBC: 13.9 10*3/uL — AB (ref 4.0–10.5)

## 2016-05-30 MED ORDER — ASPIRIN 81 MG PO CHEW: 81.0000 mg | CHEWABLE_TABLET | Freq: Every day | ORAL | 0 refills | Status: AC

## 2016-05-30 MED ORDER — ISOSORBIDE MONONITRATE ER 30 MG PO TB24: 30.0000 mg | ORAL_TABLET | Freq: Every day | ORAL | 0 refills | Status: AC

## 2016-05-30 MED ORDER — HYDRALAZINE HCL 25 MG PO TABS: 25.0000 mg | ORAL_TABLET | Freq: Three times a day (TID) | ORAL | 0 refills | Status: DC

## 2016-05-30 MED ORDER — SPIRONOLACTONE 25 MG PO TABS: 25.0000 mg | ORAL_TABLET | Freq: Every day | ORAL | 0 refills | Status: AC

## 2016-05-30 MED ORDER — CARVEDILOL 12.5 MG PO TABS: 25.0000 mg | ORAL_TABLET | Freq: Two times a day (BID) | ORAL | Status: DC

## 2016-05-30 MED ORDER — AMOXICILLIN-POT CLAVULANATE 875-125 MG PO TABS: 1.0000 | ORAL_TABLET | Freq: Two times a day (BID) | ORAL | 0 refills | Status: AC

## 2016-05-30 MED ORDER — FUROSEMIDE 80 MG PO TABS: 80.0000 mg | ORAL_TABLET | Freq: Two times a day (BID) | ORAL | 0 refills | Status: AC

## 2016-05-30 MED ORDER — AMOXICILLIN-POT CLAVULANATE 875-125 MG PO TABS: 1.0000 | ORAL_TABLET | Freq: Two times a day (BID) | ORAL | 0 refills | Status: DC

## 2016-05-30 MED ORDER — ISOSORBIDE MONONITRATE ER 30 MG PO TB24
30.0000 mg | ORAL_TABLET | Freq: Every day | ORAL | Status: DC
  Administered 2016-05-30: 30 mg via ORAL
  Filled 2016-05-30: qty 1

## 2016-05-30 MED ORDER — ASPIRIN 81 MG PO CHEW: 81.0000 mg | CHEWABLE_TABLET | Freq: Every day | ORAL | 0 refills | Status: DC

## 2016-05-30 MED ORDER — ISOSORBIDE MONONITRATE ER 30 MG PO TB24: 30.0000 mg | ORAL_TABLET | Freq: Every day | ORAL | 0 refills | Status: DC

## 2016-05-30 MED ORDER — POTASSIUM CHLORIDE CRYS ER 20 MEQ PO TBCR
40.0000 meq | EXTENDED_RELEASE_TABLET | Freq: Once | ORAL | Status: AC
  Administered 2016-05-30: 40 meq via ORAL
  Filled 2016-05-30: qty 2

## 2016-05-30 MED ORDER — FUROSEMIDE 80 MG PO TABS: 80.0000 mg | ORAL_TABLET | Freq: Two times a day (BID) | ORAL | 0 refills | Status: DC

## 2016-05-30 MED ORDER — NICOTINE POLACRILEX 2 MG MT GUM: 2.0000 mg | CHEWING_GUM | OROMUCOSAL | 0 refills | Status: AC | PRN

## 2016-05-30 MED ORDER — HYDRALAZINE HCL 25 MG PO TABS: 25.0000 mg | ORAL_TABLET | Freq: Three times a day (TID) | ORAL | 0 refills | Status: AC

## 2016-05-30 MED ORDER — CARVEDILOL 25 MG PO TABS: 25.0000 mg | ORAL_TABLET | Freq: Two times a day (BID) | ORAL | 0 refills | Status: DC

## 2016-05-30 MED ORDER — SPIRONOLACTONE 25 MG PO TABS: 25.0000 mg | ORAL_TABLET | Freq: Every day | ORAL | 0 refills | Status: DC

## 2016-05-30 MED ORDER — FUROSEMIDE 80 MG PO TABS
80.0000 mg | ORAL_TABLET | Freq: Two times a day (BID) | ORAL | Status: DC
  Administered 2016-05-30: 80 mg via ORAL
  Filled 2016-05-30 (×2): qty 1

## 2016-05-30 MED ORDER — CARVEDILOL 25 MG PO TABS: 25.0000 mg | ORAL_TABLET | Freq: Two times a day (BID) | ORAL | 0 refills | Status: AC

## 2016-05-30 MED ORDER — LIVING WELL WITH DIABETES BOOK
Freq: Once | Status: DC
  Filled 2016-05-30: qty 1

## 2016-05-30 NOTE — Discharge Instructions (Signed)

## 2016-05-30 NOTE — Progress Notes (Signed)
Progress Note  Patient Name: Amber Melendez Date of Encounter: 05/30/2016  Primary Cardiologist: Dr. Mayford Knifeurner  Subjective   She has tried to walk, feels slightly better.  Inpatient Medications    Scheduled Meds: . amoxicillin-clavulanate  1 tablet Oral Q12H  . aspirin  81 mg Oral Daily  . carvedilol  12.5 mg Oral BID WC  . famotidine  40 mg Oral BID  . FLUoxetine  40 mg Oral Daily  . furosemide  80 mg Intravenous BID  . gabapentin  600 mg Oral Daily  . gabapentin  900 mg Oral QHS  . heparin  5,000 Units Subcutaneous Q8H  . hydrALAZINE  25 mg Oral TID  . insulin aspart  0-20 Units Subcutaneous TID WC  . insulin aspart protamine- aspart  35 Units Subcutaneous BID WC  . losartan  25 mg Oral Daily  . metolazone  2.5 mg Oral Daily  . polyethylene glycol  17 g Oral Daily  . potassium chloride  40 mEq Oral Once  . sodium chloride flush  3 mL Intravenous Q12H  . sodium chloride flush  3 mL Intravenous Q12H  . spironolactone  25 mg Oral Daily   Continuous Infusions: . sodium chloride    . sodium chloride     PRN Meds: sodium chloride, sodium chloride, acetaminophen, diphenhydrAMINE, gi cocktail, guaiFENesin-dextromethorphan, ipratropium-albuterol, magic mouthwash w/lidocaine, menthol-cetylpyridinium, nicotine polacrilex, ondansetron (ZOFRAN) IV, oxycodone, oxyCODONE-acetaminophen, sodium chloride flush, sodium chloride flush   Vital Signs    Vitals:   05/30/16 0050 05/30/16 0218 05/30/16 0244 05/30/16 0625  BP: (!) 135/47 (!) 137/46  (!) 158/33  Pulse: 94 88 80 96  Resp: (!) 30 13 16  (!) 24  Temp: 97.9 F (36.6 C) 97.9 F (36.6 C)    TempSrc: Oral Axillary    SpO2: 95% 97%  98%  Weight:  (!) 355 lb (161 kg)    Height:        Intake/Output Summary (Last 24 hours) at 05/30/16 0812 Last data filed at 05/30/16 0700  Gross per 24 hour  Intake              723 ml  Output             1600 ml  Net             -877 ml   Filed Weights   05/28/16 0503 05/29/16 0628 05/30/16  0218  Weight: (!) 336 lb 3.2 oz (152.5 kg) (!) 333 lb 8.9 oz (151.3 kg) (!) 355 lb (161 kg)    Telemetry    NSR- Personally Reviewed  ECG    No new EKG - Personally Reviewed  Physical Exam   GEN: WD, WN in NAD Neck: No JVD or bruit Cardiac: RRR with no M/R/G.  Respiratory: wheezing B/L GI: soft, NT, ND with active BS MS: no edema or deformity Neuro:  A&O x 3 Psych: normal affect  Labs    Chemistry  Recent Labs Lab 05/28/16 0307 05/29/16 0256 05/30/16 0357  NA 135 134* 131*  K 3.8 3.7 3.5  CL 97* 93* 89*  CO2 27 28 28   GLUCOSE 138* 122* 126*  BUN 10 14 21*  CREATININE 1.31* 1.42* 1.57*  CALCIUM 9.3 9.6 9.4  GFRNONAA 45* 40* 36*  GFRAA 52* 47* 41*  ANIONGAP 11 13 14      Hematology  Recent Labs Lab 05/27/16 1739 05/29/16 0256 05/30/16 0357  WBC 10.9* 12.8* 13.9*  RBC 4.81 4.92 5.06  HGB 12.7 13.1 13.4  HCT 39.9 40.4 41.2  MCV 83.0 82.1 81.4  MCH 26.4 26.6 26.5  MCHC 31.8 32.4 32.5  RDW 15.8* 15.7* 15.8*  PLT 250 247 280    Cardiac Enzymes No results for input(s): TROPONINI in the last 168 hours. No results for input(s): TROPIPOC in the last 168 hours.   BNP No results for input(s): BNP, PROBNP in the last 168 hours.   DDimer No results for input(s): DDIMER in the last 168 hours.   Radiology    US Abdomen Complete  Result Date: 05/23/2016 CLINICAL DATA:  Abdominal pain. EXAM: ABDOMEN ULTRASOUND COMPLETE COMPARISON:  None. FINDINGS: Gallbladder: No gallstones or wall thickening visualized. No sonographic Murphy sign noted by sonographer. Common bile duct: Diameter: 3.7 mm Liver: Diffuse increased echogenicity with no suspicious masses. A 2.9 cm cyst is seen in the right hepatic lobe. IVC: No abnormality visualized. Pancreas: Visualized portion unremarkable. Spleen: Size and appearance within normal limits. Right Kidney: Length: 11.2 cm. Echogenicity within normal limits. No mass or hydronephrosis visualized. Left Kidney: Length: 11.5 cm.  Echogenicity within normal limits. No mass or hydronephrosis visualized. Abdominal aorta: No aneurysm visualized. Other findings: None. IMPRESSION: 1. Probable hepatic steatosis. No other abnormalities identified. The study was limited by patient body habitus and shadowing bowel gas. Electronically Signed   By: Gerome Sam III M.D   On: 05/23/2016 21:34   Nm Myocar Multi W/spect Izetta Dakin Motion / Ef  Addendum Date: 05/24/2016    There was no ST segment deviation noted during stress.  No T wave inversion was noted during stress.  There is a large defect of severe severity present in the basal anterior, basal anteroseptal, basal inferoseptal, basal inferior, basal inferolateral, basal anterolateral, mid anterior, mid anteroseptal, mid inferoseptal, mid inferior, mid inferolateral, mid anterolateral, apical anterior, apical inferior, apical lateral and apex location. The defect is reversible and consistent with ischemia. This is a very poor quality study and counts are diffusely low in all areas on stress images.  The left ventricular ejection fraction is mildly decreased (45-54%).  Nuclear stress EF: 45%.  Unclear whether this represents true ischemia in all regions or is due to poor image quality in processing on stress imaging. In setting of LV dysfunction, recommend cardiac cath for further evaluation for CAD  Findings consistent with ischemia.  This is a high risk study.     Cardiac Studies   05/20/2016 2D echo Study Conclusions  - Left ventricle: The cavity size was normal. Wall thickness was   normal. Systolic function was moderately to severely reduced. The   estimated ejection fraction was in the range of 30% to 35%.   Diffuse hypokinesis. Features are consistent with a pseudonormal   left ventricular filling pattern, with concomitant abnormal   relaxation and increased filling pressure (grade 2 diastolic   dysfunction). - Left atrium: The atrium was moderately dilated. - Right  atrium: The atrium was mildly dilated.  Impressions:  - Moderate to severe global reduction in LV systolic function;   moderate diastolic dysfunction; biatrial enlargement.  Telemetry: SR, nsVT up to 10 beats, polymorphic, possibly ischemic    Patient Profile     56 y.o. female  with a PMH significant for HTN, DM, arthritis, and anxiety.  She has no cardiac history but has a family history of premature CAD with her sister dying in her 20's of an MI.  She also smokes 1/2 ppd of cigarettes.  She presented to the ER with complaints of LE edema, orthopnea and DOE  and CXRAY showed pulmonary edema.  She also complained of chest discomfort that she describes as a cramp in her epigastric region and atypical in that it improves with massage of the area.  2D echo showed moderately reduced LVF with EF 30-35% with diffuse HK and moderate BAE.  Trop is neg x 2 and BNP elevated at 249.  She tells me that she had a severe viral respiratory infection a month ago and is still recovering from it so this   Assessment & Plan    1. 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 Viral CM as she had a severe viral respiratory infection about a month ago vs. CAD - BNP 249.4 on admission - troponin x 2 negative (0.03 --> <0.03) - EKG with NSR with frequent ectopy - overall net negative 3L , no improvement in SOB yet  - weight on admission was 338 lbs, today she is 341 lbs - dry weight not yet determined - poorly controlled HTN likely contributing - She does not appear markedly volume overloaded today.  There is no LE edema and no appreciable JVD although difficult to assess due to obesity.  Can assess LVEDP at cath today.   -  Lasix increased to 40mg  IV BID yesterday  2.  Uncontrolled hypertension - now controlled - may be due to noncompliance with meds and sodium - continue ARB, Carvedilol and aldactone.  3.  Chest pain -  - atypical  - 2 day lexiscan myoview markedly  abnormal with reduced counts throughout compared to resting images.  Unclear whether this is related to processing or actually represents ischemia.  Considered a high risk scan in setting of LV dysfunction.   - Cardiac catheterization scheduled for 3pm today - continue IV Heparin gtt  4. nsVT - up to 10 beats, carvedilol increased to 6.25 mg po BID  5. AKI - sCr 1.09 yesteday (1.09 admit) - monitor kidney function on PO diuretics - if sCr continues to trend up any further, will back off on lasix  56 y.o. female with a PMH significant for HTN, DM, arthritis, and anxiety, morbid obesity, ongoing smoking, no cardiac history but has a family history of premature CAD with her sister dying in her 25's of an MI. The patient was admitted with new onset acute systolic CHF LVEF 30-35% with diffuse HK . Complaining of atypical chest pain, troponin is neg x 2 and BNP 249. She has had a recent severe viral respiratory infection a month ago and is still recovering from it.  Plan:  -normal coronaries, diff dg myocarditis vs dilated CMP, started on carvedilol and losartan,  - d/c iv Lasix, metolazone, crea is uptrending - start lasix 80 mg po BID - still hypertensive, increase carvedilol to 25 mg po BID, d/c losartan, start imdur 30 mg po daily - improved nsVT with increased carvedilol, still hypertensive, I would increase coreq to 12.5 mg po BID. - arrange for Life vest - outpatient repeat echo in 3 months - outpatient follow in 1 week with labs.  Tobias Alexander, MD 05/30/2016

## 2016-05-30 NOTE — Plan of Care (Signed)
Problem: Food- and Nutrition-Related Knowledge Deficit (NB-1.1) Goal: Nutrition education Formal process to instruct or train a patient/client in a skill or to impart knowledge to help patients/clients voluntarily manage or modify food choices and eating behavior to maintain or improve health. Outcome: Completed/Met Date Met: 05/30/16 Nutrition Education Note  RD consulted for nutrition education regarding a Heart Healthy diet.  Pt lives with her aunt who does the cooking. Reports not following diet at home. She and her aunt have received DM education at home. Focus today's education more on salt.   RD provided "Heart Healthy Nutrition Therapy" handout from the [employer]. Reviewed patient's dietary recall. Provided examples on ways to decrease sodium and fat intake in diet. Discouraged intake of processed foods and use of salt shaker. Encouraged fresh fruits and vegetables as well as whole grain sources of carbohydrates to maximize fiber intake. Teach back method used.  Expect good compliance.  Body mass index is 54.78 kg/m. Pt meets criteria for morbid obesity class III based on current BMI.  Current diet order is Heart Healthy/CHO modified, patient is consuming approximately 100% of meals at this time. Labs and medications reviewed. No further nutrition interventions warranted at this time. RD contact information provided. If additional nutrition issues arise, please re-consult RD.  [place], [place], [place] Pager [phone] After Hours Pager

## 2016-05-30 NOTE — Discharge Summary (Signed)
Physician Discharge Summary  Amber Melendez ZOX:[id number] DOB: 19-Apr-1960 DOA: 05/19/2016  PCP: Elpidio Eric, MD  Admit date: 05/19/2016 Discharge date: 05/30/2016  Admitted From: Home Disposition:  Home  Recommendations for Outpatient Follow-up:  1. Follow up with PCP in 1 week 2. Follow up with Cardiology on 5/17 as scheduled 3. Follow up with a dentist as outpatient for tooth abscess  4. Follow up with outpatient physical therapy 5. Please obtain BMP/CBC in 1 week   Home Health: No  Equipment/Devices: 3 n 1, rolling walker, life vest   Discharge Condition: Stable CODE STATUS: Full  Diet recommendation: Heart healthy   Brief/Interim Summary: Amber Melendez is a 56 year old female with past medical history of hypertension, diabetes who presented to the emergency department with lower extremity edema, orthopnea and shortness of breath. She was admitted with findings of pulmonary edema and was found to have new diagnosis of CHF with low ejection fraction. Cardiology was consulted recommended nuclear stress tests which came high risk for CAD. She underwent heart cath on 5/8 with patent coronary arteries. Patient's medications were adjusted to manage elevated blood pressure, nonsustained V. tach, acute combined systolic and diastolic heart failure. Patient has a follow-up with outpatient cardiology.   Discharge Diagnoses:  Principal Problem:   Elevated troponin Active Problems:   CHF exacerbation (HCC)   HTN (hypertension)   DM2 (diabetes mellitus, type 2) (HCC)   Acute exacerbation of CHF (congestive heart failure) (HCC)   SOB (shortness of breath)   Acute systolic CHF (congestive heart failure), NYHA class 3 (HCC)   DCM (dilated cardiomyopathy) (HCC)   Chest pain   Elevated brain natriuretic peptide (BNP) level  Acute combined systolic and diastolic congestive heart failure -Ddx of myocarditis, dilated cardiomyopathy  -EF 30-35% with diffuses hypokinesis  -Nuclear stress test came  high risk for ischemia -Heart cath 5/8 with normal coronaries   -Strict I&O's, fluid restriction, monitor daily weights -Cardiology following -On Coreg, Imdur, Aldactone and Lasix. Dose adjusted per cardiology  -Will need outpatient echo in 3 months. Follow up with cardiology   NSVT -Coreg dose increased, arrange for lifevest per cardiology  -Telemetry   Acute kidney injury  -Due to contrast, diuretics, ARB  -Stop ARB -Monitor BMP as outpatient   Essential hypertension -Continue Coreg, Aldactone, hydralazine and Lasix  Diabetes mellitus type 2 -A1c 7.5 - 05/22/16  -Novolog 70/30, SSI   GERD -Continue GI cocktail, Pepcid 40 mg BID  Tooth abscess  -Present prior to admission, was getting treatment with abx -Augmentin  -Needs follow up with a dentist as outpatient   Thyroid nodules x 2  -US showed Small bilateral solid isoechoic TR 3 nodules. These do not meet criteria for biopsy or follow-up. TSH normal   Tobacco abuse -Cessation discussed   Viral laryngitis -Complaining of sore throat. Likely viral in etiology. Supportive care   Discharge Instructions  Discharge Instructions    (HEART FAILURE PATIENTS) Call MD:  Anytime you have any of the following symptoms: 1) 3 pound weight gain in 24 hours or 5 pounds in 1 week 2) shortness of breath, with or without a dry hacking cough 3) swelling in the hands, feet or stomach 4) if you have to sleep on extra pillows at night in order to breathe.    Complete by:  As directed    Call MD for:  difficulty breathing, headache or visual disturbances    Complete by:  As directed    Call MD for:  extreme fatigue  Complete by:  As directed    Call MD for:  persistant dizziness or light-headedness    Complete by:  As directed    Call MD for:  persistant nausea and vomiting    Complete by:  As directed    Call MD for:  severe uncontrolled pain    Complete by:  As directed    Diet - low sodium heart healthy    Complete by:   As directed    Discharge instructions    Complete by:  As directed    You were cared for by a hospitalist during your hospital stay. If you have any questions about your discharge medications or the care you received while you were in the hospital after you are discharged, you can call the unit and asked to speak with the hospitalist on call if the hospitalist that took care of you is not available. Once you are discharged, your primary care physician will handle any further medical issues. Please note that NO REFILLS for any discharge medications will be authorized once you are discharged, as it is imperative that you return to your primary care physician (or establish a relationship with a primary care physician if you do not have one) for your aftercare needs so that they can reassess your need for medications and monitor your lab values.   Increase activity slowly    Complete by:  As directed      Allergies as of 05/30/2016      Reactions   Lisinopril Other (See Comments)   Cough      Medication List    STOP taking these medications   metoprolol tartrate 25 MG tablet Commonly known as:  LOPRESSOR   NIFEdipine 60 MG 24 hr tablet Commonly known as:  PROCARDIA XL/ADALAT-CC     TAKE these medications   amoxicillin-clavulanate 875-125 MG tablet Commonly known as:  AUGMENTIN Take 1 tablet by mouth every 12 (twelve) hours.   aspirin 81 MG chewable tablet Chew 1 tablet (81 mg total) by mouth daily. Start taking on:  05/31/2016   carvedilol 25 MG tablet Commonly known as:  COREG Take 1 tablet (25 mg total) by mouth 2 (two) times daily with a meal.   FLUoxetine 40 MG capsule Commonly known as:  PROZAC Take 40 mg by mouth daily.   furosemide 80 MG tablet Commonly known as:  LASIX Take 1 tablet (80 mg total) by mouth 2 (two) times daily.   gabapentin 300 MG capsule Commonly known as:  NEURONTIN Take 600-900 mg by mouth 2 (two) times daily. 600 MG in Am and 900 MG in evening    hydrALAZINE 25 MG tablet Commonly known as:  APRESOLINE Take 1 tablet (25 mg total) by mouth 3 (three) times daily.   insulin lispro protamine-lispro (75-25) 100 UNIT/ML Susp injection Commonly known as:  HUMALOG 75/25 MIX Inject 40-50 Units into the skin 2 (two) times daily with a meal. 50 units in the AM 40 units in the evening   isosorbide mononitrate 30 MG 24 hr tablet Commonly known as:  IMDUR Take 1 tablet (30 mg total) by mouth daily. Start taking on:  05/31/2016   nicotine polacrilex 2 MG gum Commonly known as:  NICORETTE Take 1 each (2 mg total) by mouth as needed for smoking cessation.   omeprazole 40 MG capsule Commonly known as:  PRILOSEC Take 1 capsule (40 mg total) by mouth 2 (two) times daily before a meal. What changed:  medication strength  how  much to take  when to take this   oxycodone 30 MG immediate release tablet Commonly known as:  ROXICODONE Take 30 mg by mouth every 6 (six) hours as needed for pain.   spironolactone 25 MG tablet Commonly known as:  ALDACTONE Take 1 tablet (25 mg total) by mouth daily. Start taking on:  05/31/2016   varenicline 0.5 MG tablet Commonly known as:  CHANTIX Take 0.5 mg by mouth 2 (two) times daily.   vitamin E 100 UNIT capsule Take 100 Units by mouth daily.            Durable Medical Equipment        Start     Ordered   05/28/16 1416  For home use only DME Vest life vest  Once     05/28/16 1415   05/21/16 1353  For home use only DME 3 n 1  Once     05/21/16 1352   05/21/16 1352  For home use only DME 4 wheeled rolling walker with seat  Once    Question Answer Comment  Patient needs a walker to treat with the following condition Chronic back pain   Patient needs a walker to treat with the following condition Arthritis      05/21/16 1351     Follow-up Information    Elpidio Eric, MD. Schedule an appointment as soon as possible for a visit in 1 week(s).   Specialty:  Family Medicine Why:  hospital  follow up  Contact information: [address] [place] [address] [phone]        [facility] Follow up.   Specialty:  Rehabilitation Why:  for outpatient physical therapy Contact information: [address]  [phone] mc [address] [place] [address] [phone]       Janetta Hora, [place]. Go on 06/05/2016.   Specialties:  Cardiology, Radiology Why:  @ 8 am for follow up. Please arrive at least 10 minutes early to be checked in Contact information: [address] [place] [address] [phone]          Allergies  Allergen Reactions  . Lisinopril Other (See Comments)    Cough    Consultations:  Cardiology   Procedures/Studies: Dg Chest 2 View  Result Date: 05/19/2016 CLINICAL DATA:  Chest pain. EXAM: CHEST  2 VIEW COMPARISON:  Radiographs of August 06, 2012. FINDINGS: Stable cardiomegaly. No pneumothorax or pleural effusion is noted. Mild central pulmonary vascular congestion is noted. No consolidative process is noted. Bony thorax is unremarkable. IMPRESSION: Stable cardiomegaly. Mild central pulmonary vascular congestion is noted. Electronically Signed   By: Lupita Raider, M.D.   On: 05/19/2016 19:30   US Abdomen Complete  Result Date: 05/23/2016 CLINICAL DATA:  Abdominal pain. EXAM: ABDOMEN ULTRASOUND COMPLETE COMPARISON:  None. FINDINGS: Gallbladder: No gallstones or wall thickening visualized. No sonographic Murphy sign noted by sonographer. Common bile duct: Diameter: 3.7 mm Liver: Diffuse increased echogenicity with no suspicious masses. A 2.9 cm cyst is seen in the right hepatic lobe. IVC: No abnormality visualized. Pancreas: Visualized portion unremarkable. Spleen: Size and appearance within normal limits. Right Kidney: Length: 11.2 cm. Echogenicity within normal limits. No mass or hydronephrosis visualized. Left Kidney: Length: 11.5 cm. Echogenicity within normal  limits. No mass or hydronephrosis visualized. Abdominal aorta: No aneurysm visualized. Other findings: None. IMPRESSION: 1. Probable hepatic steatosis. No other abnormalities identified. The study was limited by patient body habitus and shadowing bowel gas.  Electronically Signed   By: Gerome Sam III M.D   On: 05/23/2016 21:34   US Renal  Result Date: 05/22/2016 CLINICAL DATA:  Initial evaluation for acute renal injury, elevated BUN and creatinine. History of diabetes, hypertension, obesity. EXAM: RENAL / URINARY TRACT ULTRASOUND COMPLETE COMPARISON:  None. FINDINGS: Right Kidney: Length: 10.9 cm. Echogenicity within normal limits. No mass or hydronephrosis visualized. Left Kidney: Length: 10.8 cm. Echogenicity within normal limits. No mass or hydronephrosis visualized. Bladder: Visualized due to body habitus. IMPRESSION: Normal renal ultrasound.  No hydronephrosis. Electronically Signed   By: Rise Mu M.D.   On: 05/22/2016 21:25   Nm Myocar Multi W/spect W/wall Motion / Ef  Addendum Date: 05/24/2016    There was no ST segment deviation noted during stress.  No T wave inversion was noted during stress.  There is a large defect of severe severity present in the basal anterior, basal anteroseptal, basal inferoseptal, basal inferior, basal inferolateral, basal anterolateral, mid anterior, mid anteroseptal, mid inferoseptal, mid inferior, mid inferolateral, mid anterolateral, apical anterior, apical inferior, apical lateral and apex location. The defect is reversible and consistent with ischemia. This is a very poor quality study and counts are diffusely low in all areas on stress images.  The left ventricular ejection fraction is mildly decreased (45-54%).  Nuclear stress EF: 45%.  Unclear whether this represents true ischemia in all regions or is due to poor image quality in processing on stress imaging. In setting of LV dysfunction, recommend cardiac cath for further evaluation for CAD   Findings consistent with ischemia.  This is a high risk study.    Result Date: 05/24/2016  There was no ST segment deviation noted during stress.  No T wave inversion was noted during stress.  There is a large defect of severe severity present in the basal anterior, basal anteroseptal, basal inferoseptal, basal inferior, basal inferolateral, basal anterolateral, mid anterior, mid anteroseptal, mid inferoseptal, mid inferior, mid inferolateral, mid anterolateral, apical anterior, apical inferior, apical lateral and apex location. The defect is reversible and consistent with ischemia. This is a very poor quality study and counts are diffusely low in all areas on stress images.  The left ventricular ejection fraction is mildly decreased (45-54%).  Nuclear stress EF: 45%.  Unclear whether this represents true ischemia in all regions or is due to poor image quality in processing on stress imaging. In setting of LV dysfunction, recommend cardiac cath for further evaluation for CAD    Dg Chest Port 1 View  Result Date: 05/22/2016 CLINICAL DATA:  Acute exacerbation of CHF EXAM: PORTABLE CHEST 1 VIEW COMPARISON:  05/19/2016 FINDINGS: Cardiomegaly with vascular congestion. Mild interstitial prominence may reflect interstitial edema. No effusions. IMPRESSION: Cardiomegaly with vascular congestion. Possible early interstitial edema. Electronically Signed   By: Charlett Nose M.D.   On: 05/22/2016 11:32   US Thyroid  Result Date: 05/26/2016 CLINICAL DATA:  Thyroid enlargement on exam EXAM: THYROID ULTRASOUND TECHNIQUE: Ultrasound examination of the thyroid gland and adjacent soft tissues was performed. COMPARISON:  None available FINDINGS: Parenchymal Echotexture: Mildly heterogenous Isthmus: 4 mm Right lobe: 4.2 x 2.0 x 2.4 cm Left lobe: 4.6 x 2.0 x 2.1 cm _________________________________________________________ Estimated total number of nodules >/= 1 cm: 1 Number of spongiform nodules >/=  2 cm not described below  (TR1): 0 Number of mixed cystic and solid nodules >/= 1.5 cm not described below (TR2): 0 _________________________________________________________ Nodule # 1: Location: Right; Inferior Maximum size: 1.0 cm; Other 2 dimensions: 0.6 x  0.9 cm Composition: solid/almost completely solid (2) Echogenicity: isoechoic (1) Shape: not taller-than-wide (0) Margins: ill-defined (0) Echogenic foci: none (0) ACR TI-RADS total points: 3. ACR TI-RADS risk category: TR3 (3 points). ACR TI-RADS recommendations: Given size (<1.4 cm) and appearance, this nodule does NOT meet TI-RADS criteria for biopsy or dedicated follow-up. _________________________________________________________ Nodule # 2: Location: Left; Mid Maximum size: 0.9 cm; Other 2 dimensions: 0.5 x 0.7 cm Composition: solid/almost completely solid (2) Echogenicity: isoechoic (1) Shape: not taller-than-wide (0) Margins: ill-defined (0) Echogenic foci: none (0) ACR TI-RADS total points: 3. ACR TI-RADS risk category: TR3 (3 points). ACR TI-RADS recommendations: Given size (<1.4 cm) and appearance, this nodule does NOT meet TI-RADS criteria for biopsy or dedicated follow-up. _________________________________________________________ IMPRESSION: Small bilateral solid isoechoic TR 3 nodules, as detailed above. These do not meet criteria for biopsy or follow-up. The above is in keeping with the ACR TI-RADS recommendations - J Am Coll Radiol 2017;14:587-595. Electronically Signed   By: Judie Petit.  Shick M.D.   On: 05/26/2016 12:29    Echo 5/1  Study Conclusions  - Left ventricle: The cavity size was normal. Wall thickness was   normal. Systolic function was moderately to severely reduced. The   estimated ejection fraction was in the range of 30% to 35%.   Diffuse hypokinesis. Features are consistent with a pseudonormal   left ventricular filling pattern, with concomitant abnormal   relaxation and increased filling pressure (grade 2 diastolic   dysfunction). - Left atrium: The  atrium was moderately dilated. - Right atrium: The atrium was mildly dilated.  Impressions:  - Moderate to severe global reduction in LV systolic function;   moderate diastolic dysfunction; biatrial enlargement.   Heart cath 5/8  Widely patent coronary arteries. Codominant anatomy.  Dilated and severely hypocontractile left ventricle. EF less than 30%. Elevated LVEDP consistent with combined systolic and diastolic heart failure.   Discharge Exam: Vitals:   05/30/16 0625 05/30/16 0830  BP: (!) 158/33 130/65  Pulse: 96 82  Resp: (!) 24 (!) 21  Temp:  98.4 F (36.9 C)   Vitals:   05/30/16 0218 05/30/16 0244 05/30/16 0625 05/30/16 0830  BP: (!) 137/46  (!) 158/33 130/65  Pulse: 88 80 96 82  Resp: 13 16 (!) 24 (!) 21  Temp: 97.9 F (36.6 C)   98.4 F (36.9 C)  TempSrc: Axillary   Oral  SpO2: 97%  98% 95%  Weight: (!) 161 kg (355 lb)     Height:        General: Pt is alert, awake, not in acute distress Cardiovascular: RRR, S1/S2 +, no rubs, no gallops Respiratory: CTA bilaterally, no wheezing, no rhonchi Abdominal: Soft, NT, ND, bowel sounds + Extremities: no edema, no cyanosis    The results of significant diagnostics from this hospitalization (including imaging, microbiology, ancillary and laboratory) are listed below for reference.     Microbiology: No results found for this or any previous visit (from the past 240 hour(s)).   Labs: BNP (last 3 results)  Recent Labs  05/19/16 1855 05/23/16 0634  BNP 249.4* 120.3*   Basic Metabolic Panel:  Recent Labs Lab 05/24/16 0611 05/26/16 0524 05/27/16 1739 05/28/16 0307 05/29/16 0256 05/30/16 0357  NA 137 135  --  135 134* 131*  K 3.9 3.7  --  3.8 3.7 3.5  CL 101 99*  --  97* 93* 89*  CO2 26 27  --  27 28 28   GLUCOSE 136* 134*  --  138* 122* 126*  BUN 14 11  --  10 14 21*  CREATININE 1.16* 1.09* 1.20* 1.31* 1.42* 1.57*  CALCIUM 9.1 9.0  --  9.3 9.6 9.4   Liver Function Tests: No results for  input(s): AST, ALT, ALKPHOS, BILITOT, PROT, ALBUMIN in the last 168 hours. No results for input(s): LIPASE, AMYLASE in the last 168 hours. No results for input(s): AMMONIA in the last 168 hours. CBC:  Recent Labs Lab 05/26/16 0529 05/27/16 0556 05/27/16 1739 05/29/16 0256 05/30/16 0357  WBC 9.9 10.8* 10.9* 12.8* 13.9*  HGB 13.6 12.9 12.7 13.1 13.4  HCT 41.8 40.4 39.9 40.4 41.2  MCV 83.3 83.5 83.0 82.1 81.4  PLT 261 267 250 247 280   Cardiac Enzymes: No results for input(s): CKTOTAL, CKMB, CKMBINDEX, TROPONINI in the last 168 hours. BNP: Invalid input(s): POCBNP CBG:  Recent Labs Lab 05/29/16 1218 05/29/16 1648 05/29/16 2054 05/30/16 0552 05/30/16 1232  GLUCAP 129* 133* 127* 147* 105*   D-Dimer No results for input(s): DDIMER in the last 72 hours. Hgb A1c No results for input(s): HGBA1C in the last 72 hours. Lipid Profile No results for input(s): CHOL, HDL, LDLCALC, TRIG, CHOLHDL, LDLDIRECT in the last 72 hours. Thyroid function studies No results for input(s): TSH, T4TOTAL, T3FREE, THYROIDAB in the last 72 hours.  Invalid input(s): FREET3 Anemia work up No results for input(s): VITAMINB12, FOLATE, FERRITIN, TIBC, IRON, RETICCTPCT in the last 72 hours. Urinalysis No results found for: COLORURINE, APPEARANCEUR, LABSPEC, PHURINE, GLUCOSEU, HGBUR, BILIRUBINUR, KETONESUR, PROTEINUR, UROBILINOGEN, NITRITE, LEUKOCYTESUR Sepsis Labs Invalid input(s): PROCALCITONIN,  WBC,  LACTICIDVEN Microbiology No results found for this or any previous visit (from the past 240 hour(s)).   Time coordinating discharge: 40 minutes  SIGNED:  Noralee Stain, DO [facility] Pager [phone]  If 7PM-7AM, please contact night-coverage [email or web address] Password [email or web address] 05/30/2016, 1:32 PM

## 2016-05-30 NOTE — Care Management Important Message (Signed)
Important Message  Patient Details  Name: Amber Melendez MRN: [id number] Date of Birth: 10/24/1960   Medicare Important Message Given:  Yes    Gwendolynn Rokia Bosket 05/30/2016, 10:48 AM

## 2016-06-01 ENCOUNTER — Encounter (HOSPITAL_COMMUNITY): Payer: Self-pay | Admitting: Radiology

## 2016-06-01 ENCOUNTER — Emergency Department (HOSPITAL_COMMUNITY): Payer: Medicare Other

## 2016-06-01 ENCOUNTER — Emergency Department (HOSPITAL_COMMUNITY)
Admission: EM | Admit: 2016-06-01 | Discharge: 2016-06-01 | Disposition: A | Discharge status: Home / Self Care | Payer: Medicare Other | Attending: Emergency Medicine | Admitting: Emergency Medicine

## 2016-06-01 DIAGNOSIS — I509 Heart failure, unspecified: Secondary | ICD-10-CM | POA: Insufficient documentation

## 2016-06-01 DIAGNOSIS — R1013 Epigastric pain: Secondary | ICD-10-CM

## 2016-06-01 DIAGNOSIS — Z7982 Long term (current) use of aspirin: Secondary | ICD-10-CM | POA: Insufficient documentation

## 2016-06-01 DIAGNOSIS — I110 Hypertensive heart disease with heart failure: Secondary | ICD-10-CM | POA: Insufficient documentation

## 2016-06-01 DIAGNOSIS — Z79899 Other long term (current) drug therapy: Secondary | ICD-10-CM | POA: Insufficient documentation

## 2016-06-01 DIAGNOSIS — E119 Type 2 diabetes mellitus without complications: Secondary | ICD-10-CM | POA: Insufficient documentation

## 2016-06-01 DIAGNOSIS — Z794 Long term (current) use of insulin: Secondary | ICD-10-CM | POA: Diagnosis not present

## 2016-06-01 DIAGNOSIS — F17200 Nicotine dependence, unspecified, uncomplicated: Secondary | ICD-10-CM | POA: Diagnosis not present

## 2016-06-01 HISTORY — DX: Heart failure, unspecified: I50.9

## 2016-06-01 LAB — COMPREHENSIVE METABOLIC PANEL
ALBUMIN: 3.4 g/dL — AB (ref 3.5–5.0)
ALK PHOS: 88 U/L (ref 38–126)
ALT: 42 U/L (ref 14–54)
ANION GAP: 15 (ref 5–15)
AST: 80 U/L — ABNORMAL HIGH (ref 15–41)
BUN: 20 mg/dL (ref 6–20)
CALCIUM: 9.8 mg/dL (ref 8.9–10.3)
CO2: 24 mmol/L (ref 22–32)
Chloride: 95 mmol/L — ABNORMAL LOW (ref 101–111)
Creatinine, Ser: 1.32 mg/dL — ABNORMAL HIGH (ref 0.44–1.00)
GFR calc Af Amer: 51 mL/min — ABNORMAL LOW (ref >60)
GFR calc non Af Amer: 44 mL/min — ABNORMAL LOW (ref >60)
GLUCOSE: 136 mg/dL — AB (ref 65–99)
Potassium: 3.6 mmol/L (ref 3.5–5.1)
SODIUM: 134 mmol/L — AB (ref 135–145)
Total Bilirubin: 0.8 mg/dL (ref 0.3–1.2)
Total Protein: 8.9 g/dL — ABNORMAL HIGH (ref 6.5–8.1)

## 2016-06-01 LAB — I-STAT CG4 LACTIC ACID, ED: Lactic Acid, Venous: 1.43 mmol/L (ref 0.5–1.9)

## 2016-06-01 LAB — CBC WITH DIFFERENTIAL/PLATELET
Basophils Absolute: 0 10*3/uL (ref 0.0–0.1)
Basophils Relative: 0 %
EOS PCT: 1 %
Eosinophils Absolute: 0.2 10*3/uL (ref 0.0–0.7)
HEMATOCRIT: 45.3 % (ref 36.0–46.0)
HEMOGLOBIN: 15.1 g/dL — AB (ref 12.0–15.0)
LYMPHS ABS: 3.3 10*3/uL (ref 0.7–4.0)
LYMPHS PCT: 21 %
MCH: 26.8 pg (ref 26.0–34.0)
MCHC: 33.3 g/dL (ref 30.0–36.0)
MCV: 80.5 fL (ref 78.0–100.0)
MONOS PCT: 8 %
Monocytes Absolute: 1.3 10*3/uL — ABNORMAL HIGH (ref 0.1–1.0)
Neutro Abs: 11.1 10*3/uL — ABNORMAL HIGH (ref 1.7–7.7)
Neutrophils Relative %: 70 %
Platelets: 286 10*3/uL (ref 150–400)
RBC: 5.63 MIL/uL — AB (ref 3.87–5.11)
RDW: 15.6 % — ABNORMAL HIGH (ref 11.5–15.5)
WBC: 15.9 10*3/uL — AB (ref 4.0–10.5)

## 2016-06-01 LAB — LIPASE, BLOOD: Lipase: 46 U/L (ref 11–51)

## 2016-06-01 MED ORDER — SUCRALFATE 1 G PO TABS: 1.0000 g | ORAL_TABLET | Freq: Three times a day (TID) | ORAL | 0 refills | Status: AC

## 2016-06-01 MED ORDER — SODIUM CHLORIDE 0.9 % IV BOLUS (SEPSIS)
1000.0000 mL | Freq: Once | INTRAVENOUS | Status: AC
  Administered 2016-06-01: 1000 mL via INTRAVENOUS

## 2016-06-01 MED ORDER — FENTANYL CITRATE (PF) 100 MCG/2ML IJ SOLN
50.0000 ug | Freq: Once | INTRAMUSCULAR | Status: AC
  Administered 2016-06-01: 50 ug via INTRAVENOUS
  Filled 2016-06-01: qty 2

## 2016-06-01 MED ORDER — GI COCKTAIL ~~LOC~~
30.0000 mL | Freq: Once | ORAL | Status: AC
  Administered 2016-06-01: 30 mL via ORAL
  Filled 2016-06-01: qty 30

## 2016-06-01 MED ORDER — IOPAMIDOL (ISOVUE-300) INJECTION 61%
INTRAVENOUS | Status: AC
  Administered 2016-06-01: 100 mL
  Filled 2016-06-01: qty 100

## 2016-06-01 NOTE — ED Notes (Signed)
When discharging patient, family and patient had concerns of going home due to patient still having a lot of pain.  Spoke to EDP, and he has ordered CT and pain meds.  Family and patient made aware.

## 2016-06-01 NOTE — ED Notes (Signed)
Pt states having charlie  Horse in legs

## 2016-06-01 NOTE — ED Provider Notes (Signed)
[facility] DEPT Provider Note   CSN: [id number] Arrival date & time: 06/01/16  0905     History   Chief Complaint Chief Complaint  Patient presents with  . Abdominal Pain    HPI Amber Melendez is a 56 y.o. female.  The history is provided by the patient.  Abdominal Pain   This is a new problem. The current episode started more than 1 week ago. The problem occurs daily. The problem has not changed since onset.The pain is associated with an unknown factor. The pain is located in the epigastric region. The quality of the pain is aching and cramping. The pain is at a severity of 5/10. The pain is moderate. Pertinent negatives include anorexia, fever, diarrhea, flatus, hematochezia, melena, nausea, vomiting, constipation, dysuria, frequency, hematuria, headaches, arthralgias and myalgias. The symptoms are aggravated by palpation. Nothing relieves the symptoms. Her past medical history is significant for GERD.    Past Medical History:  Diagnosis Date  . Anxiety   . Arthritis   . CHF (congestive heart failure) (HCC)   . Diabetes mellitus without complication (HCC)   . Hypertension     Patient Active Problem List   Diagnosis Date Noted  . Elevated brain natriuretic peptide (BNP) level   . Elevated troponin   . SOB (shortness of breath)   . Acute systolic CHF (congestive heart failure), NYHA class 3 (HCC)   . DCM (dilated cardiomyopathy) (HCC)   . Chest pain   . Acute exacerbation of CHF (congestive heart failure) (HCC) 05/21/2016  . HTN (hypertension) 05/20/2016  . DM2 (diabetes mellitus, type 2) (HCC) 05/20/2016  . CHF exacerbation (HCC) 05/19/2016    Past Surgical History:  Procedure Laterality Date  . LEFT HEART CATH AND CORONARY ANGIOGRAPHY N/A 05/27/2016   Procedure: Left Heart Cath and Coronary Angiography;  Surgeon: Lyn Records, MD;  Location: [facility] INVASIVE CV LAB;  Service: Cardiovascular;  Laterality: N/A;    OB History    No data available       Home  Medications    Prior to Admission medications   Medication Sig Start Date End Date Taking? Authorizing Provider  aspirin 81 MG chewable tablet Chew 1 tablet (81 mg total) by mouth daily. 05/31/16  Yes Noralee Stain Chahn-Yang, DO  carvedilol (COREG) 25 MG tablet Take 1 tablet (25 mg total) by mouth 2 (two) times daily with a meal. 05/30/16  Yes Noralee Stain Chahn-Yang, DO  FLUoxetine (PROZAC) 40 MG capsule Take 40 mg by mouth daily.   Yes [provider]  furosemide (LASIX) 80 MG tablet Take 1 tablet (80 mg total) by mouth 2 (two) times daily. 05/30/16  Yes Noralee Stain Chahn-Yang, DO  gabapentin (NEURONTIN) 300 MG capsule Take 600-900 mg by mouth 2 (two) times daily. 600 MG in Am and 900 MG in evening   Yes [provider]  hydrALAZINE (APRESOLINE) 25 MG tablet Take 1 tablet (25 mg total) by mouth 3 (three) times daily. 05/30/16  Yes Noralee Stain Chahn-Yang, DO  insulin lispro protamine-lispro (HUMALOG 75/25 MIX) (75-25) 100 UNIT/ML SUSP injection Inject 40-50 Units into the skin 2 (two) times daily with a meal. 50 units in the AM 40 units in the evening   Yes [provider]  isosorbide mononitrate (IMDUR) 30 MG 24 hr tablet Take 1 tablet (30 mg total) by mouth daily. 05/31/16  Yes Noralee Stain Chahn-Yang, DO  nicotine polacrilex (NICORETTE) 2 MG gum Take 1 each (2 mg total) by mouth as needed for smoking  cessation. 05/30/16  Yes Noralee Stainhoi, Jennifer Chahn-Yang, DO  omeprazole (PRILOSEC) 40 MG capsule Take 1 capsule (40 mg total) by mouth 2 (two) times daily before a meal. 05/24/16  Yes Randel PiggSilva Zapata, Dorma RussellEdwin, MD  oxycodone (ROXICODONE) 30 MG immediate release tablet Take 30 mg by mouth every 6 (six) hours as needed for pain.   Yes [provider]  spironolactone (ALDACTONE) 25 MG tablet Take 1 tablet (25 mg total) by mouth daily. 05/31/16  Yes Noralee Stainhoi, Jennifer Chahn-Yang, DO  sucralfate (CARAFATE) 1 g tablet Take 1 tablet (1 g total) by mouth 4 (four) times daily -  with  meals and at bedtime. 06/01/16 06/11/16  Enyla Lisbon, DO  varenicline (CHANTIX) 0.5 MG tablet Take 0.5 mg by mouth 2 (two) times daily.    [provider]  vitamin E 100 UNIT capsule Take 100 Units by mouth daily.    [provider]    Family History Family History  Problem Relation Age of Onset  . Heart attack Sister     Social History Social History  Substance Use Topics  . Smoking status: Current Every Day Smoker    Packs/day: 0.50  . Smokeless tobacco: Never Used  . Alcohol use Yes     Allergies   Lisinopril   Review of Systems Review of Systems  Constitutional: Negative for chills and fever.  HENT: Negative for ear pain and sore throat.   Eyes: Negative for pain and visual disturbance.  Respiratory: Negative for cough and shortness of breath.   Cardiovascular: Negative for chest pain and palpitations.  Gastrointestinal: Positive for abdominal pain. Negative for abdominal distention, anal bleeding, anorexia, blood in stool, constipation, diarrhea, flatus, hematochezia, melena, nausea, rectal pain and vomiting.  Genitourinary: Negative for dysuria, frequency and hematuria.  Musculoskeletal: Negative for arthralgias, back pain and myalgias.  Skin: Negative for color change and rash.  Neurological: Negative for seizures, syncope and headaches.  All other systems reviewed and are negative.    Physical Exam Updated Vital Signs  ED Triage Vitals  Enc Vitals Group     BP 06/01/16 0912 (!) 166/112     Pulse Rate 06/01/16 0912 92     Resp 06/01/16 0912 (!) 24     Temp 06/01/16 0912 97.8 F (36.6 C)     Temp Source 06/01/16 0912 Oral     SpO2 06/01/16 0912 96 %     Weight 06/01/16 0909 (!) 353 lb (160.1 kg)     Height 06/01/16 0909 5\' 8"  (1.727 m)     Head Circumference --      Peak Flow --      Pain Score 06/01/16 0907 9     Pain Loc --      Pain Edu? --      Excl. in GC? --     Physical Exam  Constitutional: She is oriented to person,  place, and time. She appears well-developed and well-nourished. No distress.  HENT:  Head: Normocephalic and atraumatic.  Eyes: Conjunctivae are normal. Pupils are equal, round, and reactive to light.  Neck: Normal range of motion. Neck supple.  Cardiovascular: Normal rate and regular rhythm.   No murmur heard. Pulmonary/Chest: Effort normal and breath sounds normal. No respiratory distress.  Abdominal: Soft. Bowel sounds are normal. She exhibits no distension. There is tenderness (TTP in epigastric and RUQ). There is no guarding.  Musculoskeletal: She exhibits no edema.  Neurological: She is alert and oriented to person, place, and time.  Skin: Skin is warm  and dry. Capillary refill takes less than 2 seconds.  Psychiatric: She has a normal mood and affect.  Nursing note and vitals reviewed.    ED Treatments / Results  Labs (all labs ordered are listed, but only abnormal results are displayed) Labs Reviewed  COMPREHENSIVE METABOLIC PANEL - Abnormal; Notable for the following:       Result Value   Sodium 134 (*)    Chloride 95 (*)    Glucose, Bld 136 (*)    Creatinine, Ser 1.32 (*)    Total Protein 8.9 (*)    Albumin 3.4 (*)    AST 80 (*)    GFR calc non Af Amer 44 (*)    GFR calc Af Amer 51 (*)    All other components within normal limits  CBC WITH DIFFERENTIAL/PLATELET - Abnormal; Notable for the following:    WBC 15.9 (*)    RBC 5.63 (*)    Hemoglobin 15.1 (*)    RDW 15.6 (*)    Neutro Abs 11.1 (*)    Monocytes Absolute 1.3 (*)    All other components within normal limits  LIPASE, BLOOD  I-STAT CG4 LACTIC ACID, ED    EKG  EKG Interpretation None       Radiology Ct Abdomen Pelvis W Contrast  Result Date: 06/01/2016 CLINICAL DATA:  Epigastric pain with nausea. EXAM: CT ABDOMEN AND PELVIS WITH CONTRAST TECHNIQUE: Multidetector CT imaging of the abdomen and pelvis was performed using the standard protocol following bolus administration of intravenous contrast.  CONTRAST:  ISOVUE-300 IOPAMIDOL (ISOVUE-300) INJECTION 61% COMPARISON:  None. FINDINGS: Lower chest: No acute abnormality. Hepatobiliary: There is a cyst in the lower right hepatic lobe. Focal fatty deposition adjacent to the falciform ligament. The portal vein is poorly assessed on this study due to timing of contrast. The gallbladder is unremarkable. Pancreas: Unremarkable. No pancreatic ductal dilatation or surrounding inflammatory changes. Spleen: Normal in size without focal abnormality. Adrenals/Urinary Tract: Adrenal glands are unremarkable. Kidneys are normal, without renal calculi, focal lesion, or hydronephrosis. Bladder is unremarkable. Stomach/Bowel: The stomach, small bowel, and colon are unremarkable. There are a few scattered colonic diverticuli with no diverticulitis. High attenuation in the appendix could represent contrast for previous study or an appendicolith. No inflammation to suggest appendicitis. Vascular/Lymphatic: No significant vascular findings are present. No enlarged abdominal or pelvic lymph nodes. Reproductive: Uterus and bilateral adnexa are unremarkable. Other: No abdominal wall hernia or abnormality. No abdominopelvic ascites. Musculoskeletal: No acute or significant osseous findings. IMPRESSION: 1. No cause for the patient's epigastric pain or nausea identified. No acute abnormalities. Electronically Signed   By: Gerome Sam III M.D   On: 06/01/2016 15:07    Procedures Procedures (including critical care time)  Medications Ordered in ED Medications  sodium chloride 0.9 % bolus 1,000 mL (0 mLs Intravenous Stopped 06/01/16 1223)  gi cocktail (Maalox,Lidocaine,Donnatal) (30 mLs Oral Given 06/01/16 0954)  fentaNYL (SUBLIMAZE) injection 50 mcg (50 mcg Intravenous Given 06/01/16 1233)  iopamidol (ISOVUE-300) 61 % injection (100 mLs  Contrast Given 06/01/16 1433)     Initial Impression / Assessment and Plan / ED Course  I have reviewed the triage vital signs and the  nursing notes.  Pertinent labs & imaging results that were available during my care of the patient were reviewed by me and considered in my medical decision making (see chart for details).     Amber Melendez is a 56 year old female with history of heart failure, reflux, diabetes, arthritis who presents to the  ED with epigastric abdominal pain. Patient's vitals at time of arrival to the ED are unremarkable and patient is overall well-appearing. Patient is without fever. Patient recently discharged here for heart failure exacerbation where she had an extensive workup including heart catheterization that showed clean coronaries. Patient had blood pressure and heart failure medications adjusted. Patient also had epigastric abdominal pain at that time as well and states symptoms have been going on for a week. She denies bloody stools, dark tarry stools, history of ulcers. Patient denies alcohol use. Patient does use anti-inflammatories for arthritis. Patient states that her omeprazole was just changed as well. She is eating and drinking well and has no nausea, vomiting, diarrhea. On exam, patient is tender in epigastric region and some tenderness in the right upper quadrant but otherwise exam is unremarkable. Patient has no tenderness in the right lower quadrant and history and physical is not consistent with appendicitis. To evaluate we'll give patient a GI cocktail and 500 mL normal saline bolus. We'll get CBC, CMP, lipase.   Patient with leukocytosis but currently has a tooth abscess and is on Augmentin. It appears stable. Patient has dentist follow-up in place. Patient otherwise unremarkable labs and no signs of hepatobiliary disease on labs. Lipase is negative and doubt pancreatitis. Patient with continued pain and will get CT ab/pelvis to r/o intra-abdominal process. Patient already with unremarkable RUQ U/S done as inpatient two days ago.   Patient given IV fentanyl for pain and now improved. Patient with  no acute findings on CT ab/pelvis and discharged home with carafate for possible gastritis. Patient given information for follow up with GI for possible EGD. Told to return to the ED if symptoms worsen.   Final Clinical Impressions(s) / ED Diagnoses   Final diagnoses:  Epigastric pain    New Prescriptions New Prescriptions   SUCRALFATE (CARAFATE) 1 G TABLET    Take 1 tablet (1 g total) by mouth 4 (four) times daily -  with meals and at bedtime.     Virgina Norfolk, DO 06/01/16 1520    Geoffery Lyons, MD 06/01/16 [phone]

## 2016-06-01 NOTE — ED Triage Notes (Signed)
PT reports increased SHOB and ABD pain . Pt DC on 05-30-16 for same problem.

## 2016-06-01 NOTE — ED Notes (Signed)
On way to CT 

## 2016-06-01 NOTE — ED Notes (Signed)
Pt assisted to bathroom and states she is unable to urinate.

## 2016-06-03 NOTE — Progress Notes (Deleted)
Cardiology Office Note    Date:  06/03/2016   ID:  Amber Melendez, DOB 10/24/1960, MRN [id number]  PCP:  Elpidio EricLord, Richard, MD  Cardiologist:  Dr. Mayford Knifeurner   CC: post hospital follow up  History of Present Illness:  Amber Melendez is a 56 y.o. female with a history of HTN, DMT2, tobacco abuse, morbid obesity and recently diagnosed combined S/D CHF who presents to clinic for post hospital follow up.   Recently admitted 4/30-5/11/18. She presented with chest pain and CHF sx. EF showed newly reduced EF 30-35%, G2DD, mod LAE, mild RV dilation. Subsequent myoview was high risk c/w ischemia and cath recommended. This was completed on 05/27/16 and showed widely patent coronary arteries with elevated LVEDP c/w combined S/D CHF. 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 She was placed on BB and ARB therapy and diuresed with IV lasix. Discharge weight was 355lbs (? Accuracy since weight the day before was 333lbs). ARB was discontinued 2/2 AKI with creat up to 1.57 and she was started on hydralazine nitrates. She also has runs of NSVT that improved with increased BB therapy. She was discharged with a LifeVest.   Medications at discharge were lasix 80mg  BID, Coreg 25mg  BID, Hydralazine 25mg  TID, imdur 30mg  daily, spiro 25mg  dialy.   Today she presents to clinic for follow up.    Past Medical History:  Diagnosis Date  . Anxiety   . Arthritis   . CHF (congestive heart failure) (HCC)   . Diabetes mellitus without complication (HCC)   . Hypertension     Past Surgical History:  Procedure Laterality Date  . LEFT HEART CATH AND CORONARY ANGIOGRAPHY N/A 05/27/2016   Procedure: Left Heart Cath and Coronary Angiography;  Surgeon: Lyn RecordsSmith, Henry W, MD;  [facility];  Service: Cardiovascular;  Laterality: N/A;    Current Medications: Outpatient Medications Prior to Visit  Medication Sig Dispense Refill  . aspirin 81 MG  chewable tablet Chew 1 tablet (81 mg total) by mouth daily. 30 tablet 0  . carvedilol (COREG) 25 MG tablet Take 1 tablet (25 mg total) by mouth 2 (two) times daily with a meal. 60 tablet 0  . FLUoxetine (PROZAC) 40 MG capsule Take 40 mg by mouth daily.    . furosemide (LASIX) 80 MG tablet Take 1 tablet (80 mg total) by mouth 2 (two) times daily. 60 tablet 0  . gabapentin (NEURONTIN) 300 MG capsule Take 600-900 mg by mouth 2 (two) times daily. 600 MG in Am and 900 MG in evening    . hydrALAZINE (APRESOLINE) 25 MG tablet Take 1 tablet (25 mg total) by mouth 3 (three) times daily. 90 tablet 0  . insulin lispro protamine-lispro (HUMALOG 75/25 MIX) (75-25) 100 UNIT/ML SUSP injection Inject 40-50 Units into the skin 2 (two) times daily with a meal. 50 units in the AM 40 units in the evening    . isosorbide mononitrate (IMDUR) 30 MG 24 hr tablet Take 1 tablet (30 mg total) by mouth daily. 30 tablet 0  . nicotine polacrilex (NICORETTE) 2 MG gum Take 1 each (2 mg total) by mouth as needed for smoking cessation. 50 tablet 0  . omeprazole (PRILOSEC) 40 MG capsule Take 1 capsule (40 mg total) by mouth 2 (two) times daily before a meal. 60 capsule 0  . oxycodone (ROXICODONE) 30 MG immediate release tablet Take 30 mg by mouth every 6 (six) hours  as needed for pain.    Marland Kitchen spironolactone (ALDACTONE) 25 MG tablet Take 1 tablet (25 mg total) by mouth daily. 30 tablet 0  . sucralfate (CARAFATE) 1 g tablet Take 1 tablet (1 g total) by mouth 4 (four) times daily -  with meals and at bedtime. 40 tablet 0  . varenicline (CHANTIX) 0.5 MG tablet Take 0.5 mg by mouth 2 (two) times daily.    . vitamin E 100 UNIT capsule Take 100 Units by mouth daily.     No facility-administered medications prior to visit.      Allergies:   Lisinopril   Social History   Social History  . Marital status: Widowed    Spouse name: N/A  . Number of children: N/A  . Years of education: N/A   Social History Main Topics  . Smoking status:  Current Every Day Smoker    Packs/day: 0.50  . Smokeless tobacco: Never Used  . Alcohol use Yes  . Drug use: No  . Sexual activity: Not on file   Other Topics Concern  . Not on file   Social History Narrative  . No narrative on file     Family History:  The patient's ***family history includes Heart attack in her sister.      *** ROS/PE    Wt Readings from Last 3 Encounters:  06/01/16 (!) 353 lb (160.1 kg)  05/30/16 (!) 355 lb (161 kg)      Studies/Labs Reviewed:   EKG:  EKG is*** ordered today.  The ekg ordered today demonstrates ***  Recent Labs: 05/23/2016: B Natriuretic Peptide 120.3 05/25/2016: TSH 2.721 06/01/2016: ALT 42; BUN 20; Creatinine, Ser 1.32; Hemoglobin 15.1; Platelets 286; Potassium 3.6; Sodium 134   Lipid Panel No results found for: CHOL, TRIG, HDL, CHOLHDL, VLDL, LDLCALC, LDLDIRECT  Additional studies/ records that were reviewed today include:  05/20/2016 2D ECHO Study Conclusions - Left ventricle: The cavity size was normal. Wall thickness was normal. Systolic function was moderately to severely reduced. The estimated ejection fraction was in the range of 30% to 35%. Diffuse hypokinesis. Features are consistent with a pseudonormal left ventricular filling pattern, with concomitant abnormal relaxation and increased filling pressure (grade 2 diastolic dysfunction). - Left atrium: The atrium was moderately dilated. - Right atrium: The atrium was mildly dilated. Impressions: - Moderate to severe global reduction in LV systolic function; moderate diastolic dysfunction; biatrial enlargement.  05/24/16 Myoview Addendum   There was no ST segment deviation noted during stress.  No T wave inversion was noted during stress.  There is a large defect of severe severity present in the basal anterior, basal anteroseptal, basal inferoseptal, basal inferior, basal inferolateral, basal anterolateral, mid anterior, mid anteroseptal, mid  inferoseptal, mid inferior, mid inferolateral, mid anterolateral, apical anterior, apical inferior, apical lateral and apex location. The defect is reversible and consistent with ischemia. This is a very poor quality study and counts are diffusely low in all areas on stress images.  The left ventricular ejection fraction is mildly decreased (45-54%).  Nuclear stress EF: 45%.  Unclear whether this represents true ischemia in all regions or is due to poor image quality in processing on stress imaging. In setting of LV dysfunction, recommend cardiac cath for further evaluation for CAD  Findings consistent with ischemia.  This is a high risk study.    Cath 06/06/16 Conclusion    Widely patent coronary arteries. Codominant anatomy.  Dilated and severely hypocontractile left ventricle. EF less than 30%. Elevated LVEDP consistent with  combined systolic and diastolic heart failure.       ASSESSMENT & PLAN:   Chronic combined S/D CHF/ NICM:  HTN:  NSVT:   Morbid obesity:  DMT2:   Medication Adjustments/Labs and Tests Ordered: Current medicines are reviewed at length with the patient today.  Concerns regarding medicines are outlined above.  Medication changes, Labs and Tests ordered today are listed in the Patient Instructions below. There are no Patient Instructions on file for this visit.   Signed, Cline Crock, PA-C  06/03/2016 10:14 AM    [facility] [address] [place], [place], [place]  [address] Phone: [phone]; Fax: [phone]

## 2016-06-05 ENCOUNTER — Ambulatory Visit: Payer: Medicare Other | Admitting: Physician Assistant

## 2016-06-10 ENCOUNTER — Ambulatory Visit: Payer: Medicare Other | Attending: Internal Medicine | Admitting: Physical Therapy

## 2016-06-11 ENCOUNTER — Encounter: Payer: Self-pay | Admitting: Physician Assistant

## 2016-06-17 ENCOUNTER — Ambulatory Visit: Payer: Medicare Other | Admitting: Physical Therapy

## 2017-08-02 IMAGING — US US ABDOMEN COMPLETE
1 series · 14 of 25 positions shown · non-contrast
Comparison: None.

CLINICAL DATA: Abdominal pain.

EXAM:
ABDOMEN ULTRASOUND COMPLETE

[Series 1: us abdomen complete · 0.33mm/px · 14 of 67 slices shown]
[im 1/67]
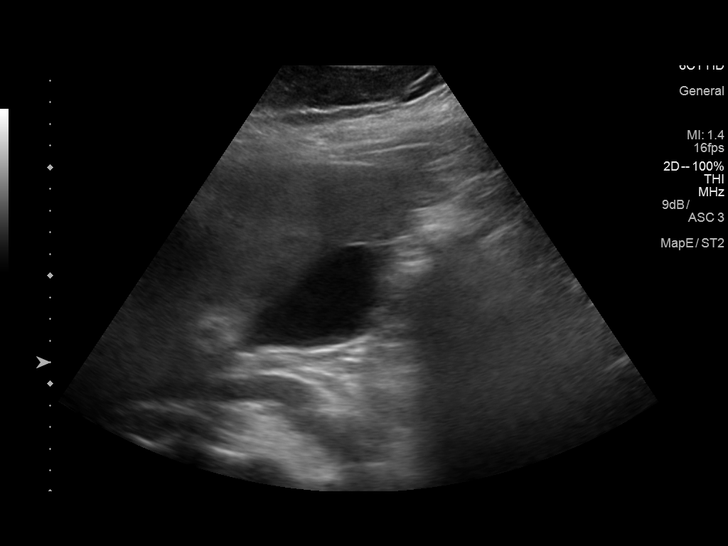
[im 6/67]
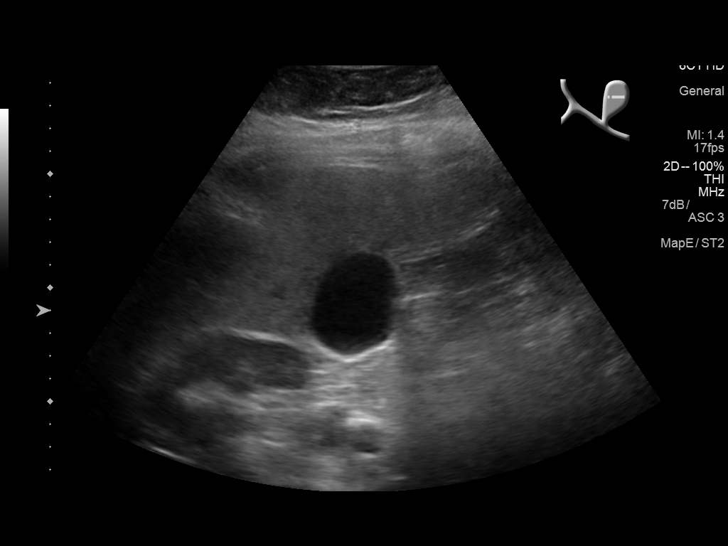
[im 12/67]
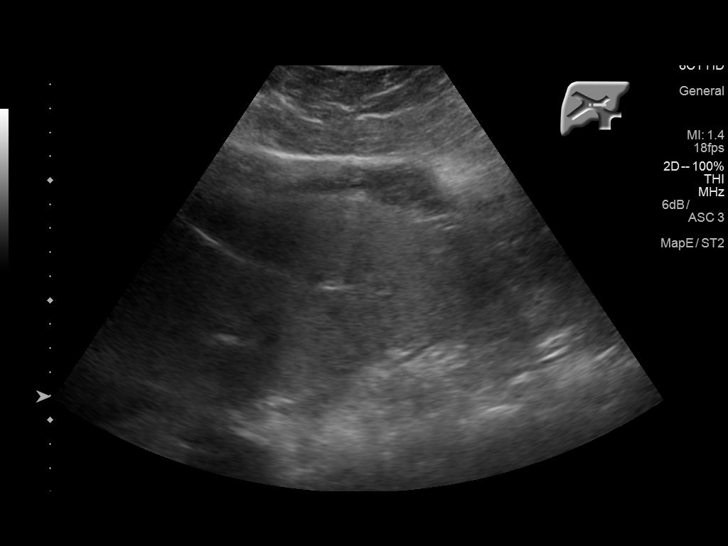
[im 17/67]
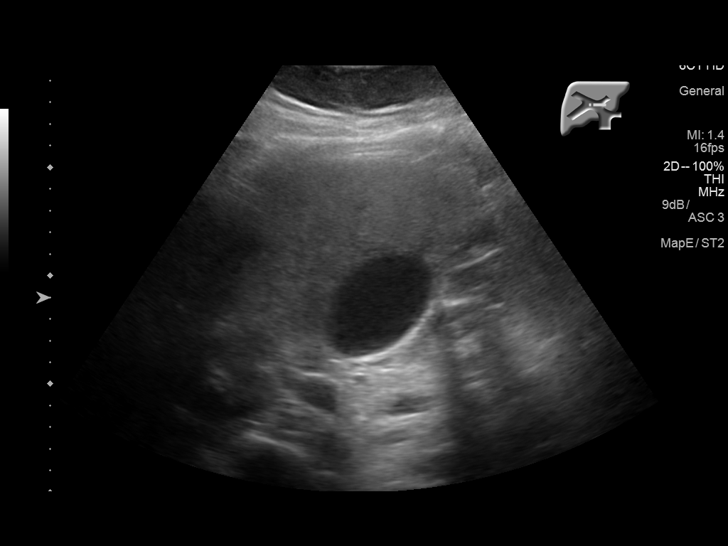
[im 23/67]
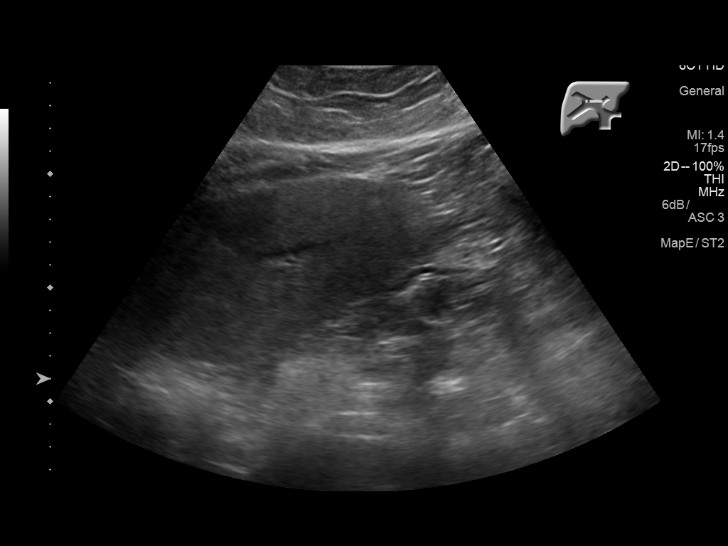
[im 25/67]
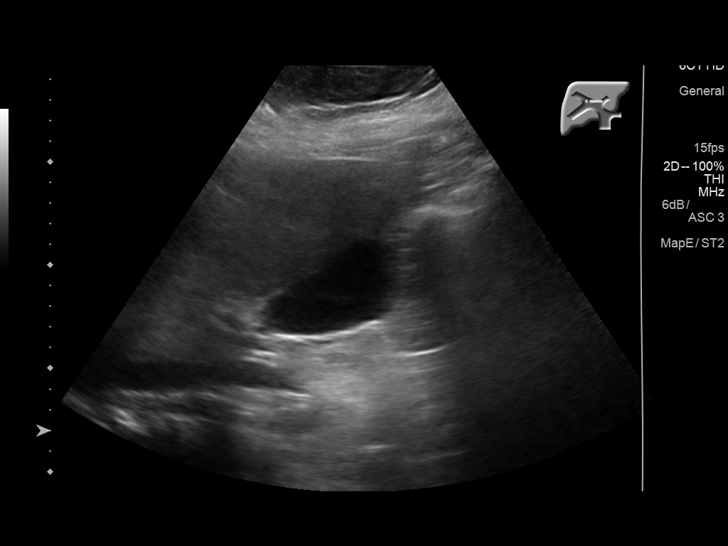
[im 31/67]
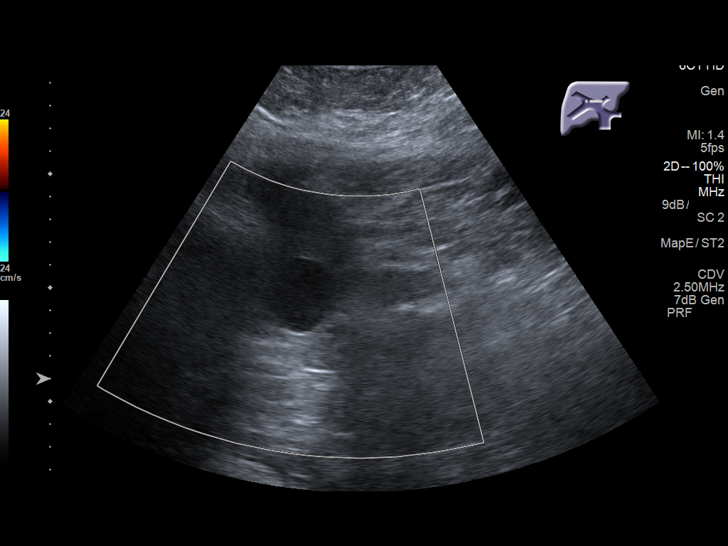
[im 36/67]
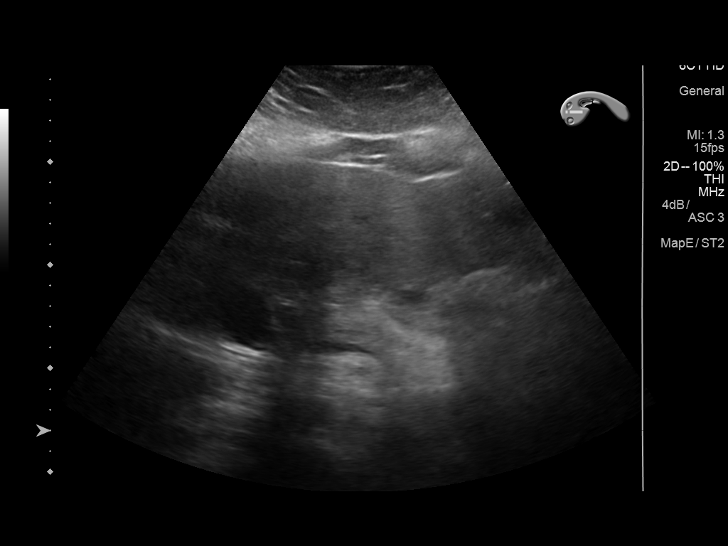
[im 42/67]
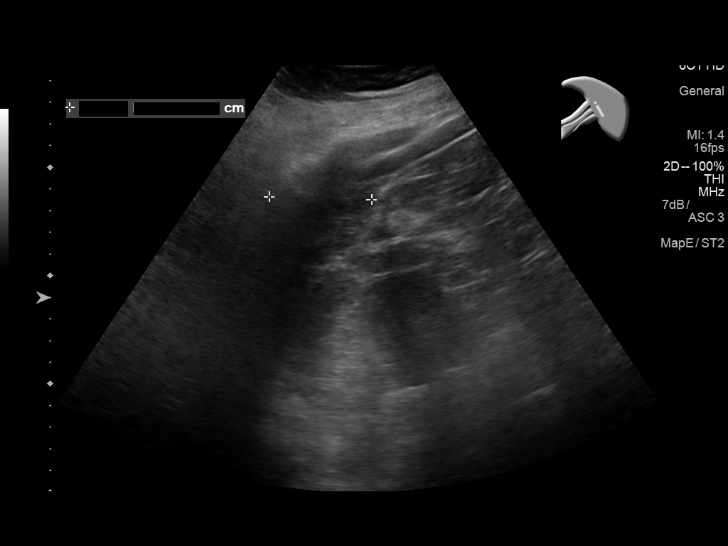
[im 45/67]
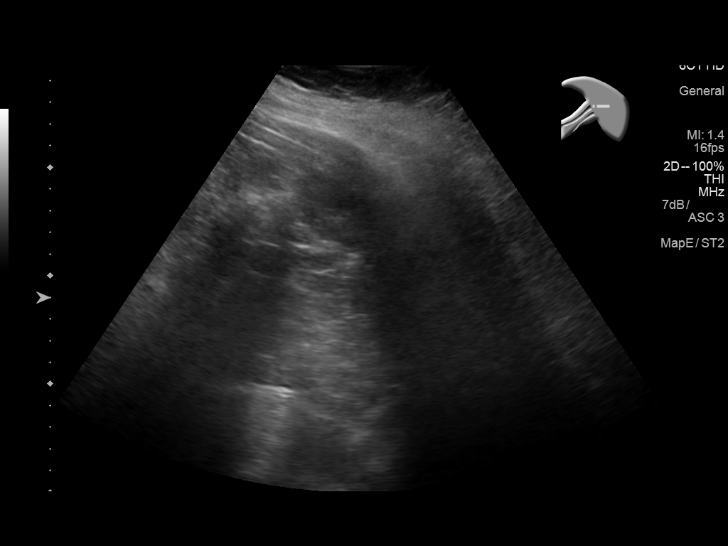
[im 50/67]
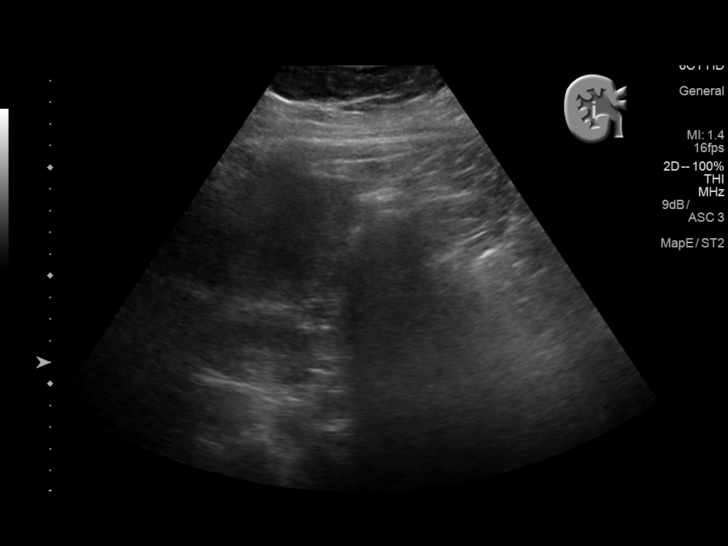
[im 56/67]
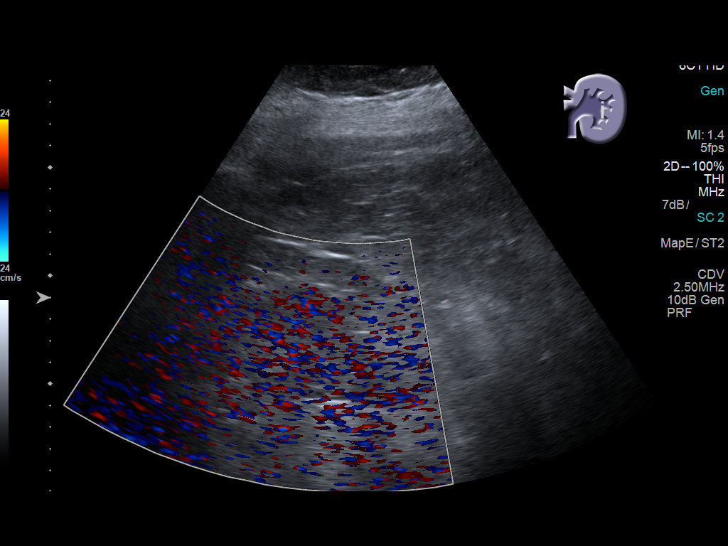
[im 61/67]
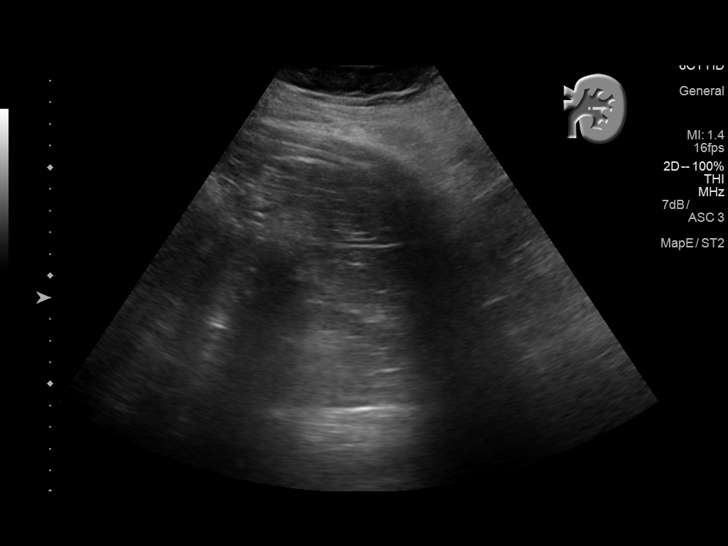
[im 67/67]
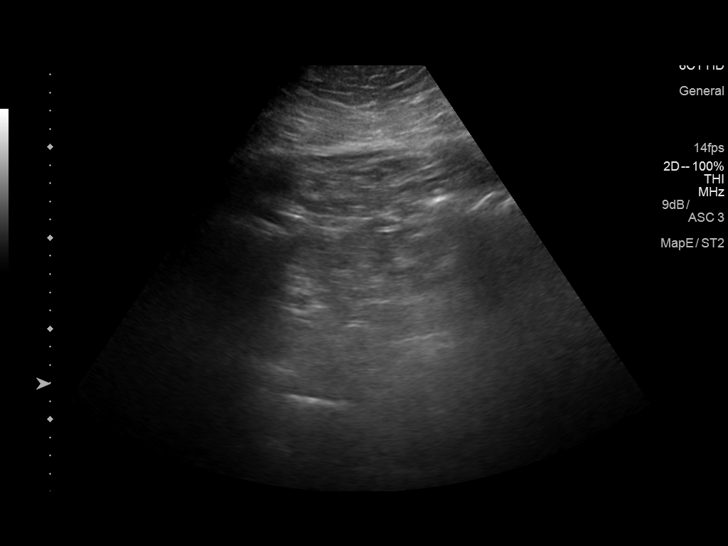

[14 of 25 positions shown; findings below may reference images not displayed]

FINDINGS: Gallbladder: No gallstones or wall thickening visualized. No
sonographic Murphy sign noted by sonographer.

Common bile duct: Diameter: 3.7 mm

Liver: Diffuse increased echogenicity with no suspicious masses. A
2.9 cm cyst is seen in the right hepatic lobe.

IVC: No abnormality visualized.

Pancreas: Visualized portion unremarkable.

Spleen: Size and appearance within normal limits.

Right Kidney: Length: 11.2 cm. Echogenicity within normal limits. No
mass or hydronephrosis visualized.

Left Kidney: Length: 11.5 cm. Echogenicity within normal limits. No
mass or hydronephrosis visualized.

Abdominal aorta: No aneurysm visualized.

Other findings: None.
IMPRESSION: 1. Probable hepatic steatosis. No other abnormalities identified.
The study was limited by patient body habitus and shadowing bowel
gas.

## 2017-09-25 IMAGING — CR DG CHEST 2V
2 series · 2 of 2 positions shown · non-contrast
Comparison: Radiographs August 06, 2012.

CLINICAL DATA: Chest pain.

EXAM:
CHEST  2 VIEW

[w chest pa]
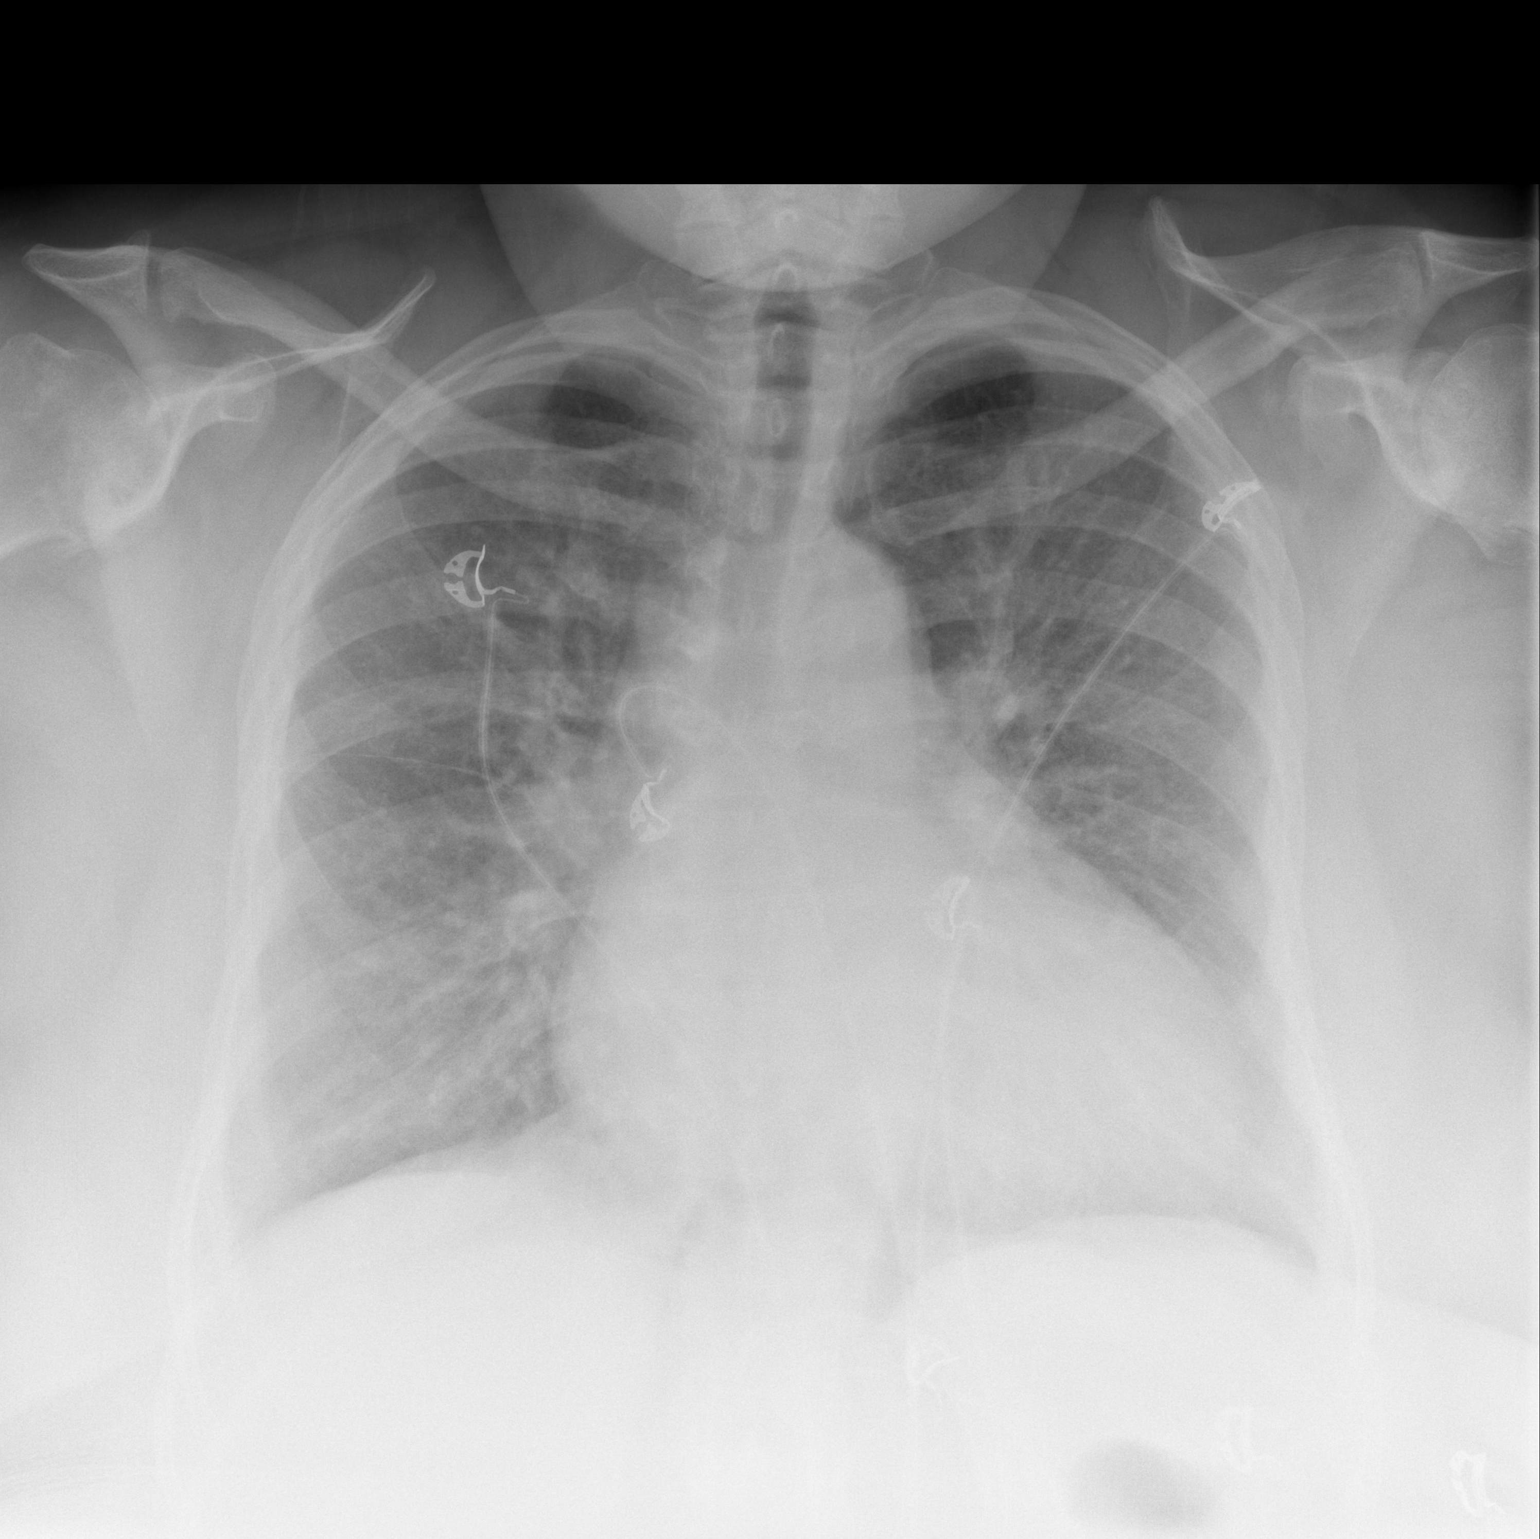

[w chest lat]
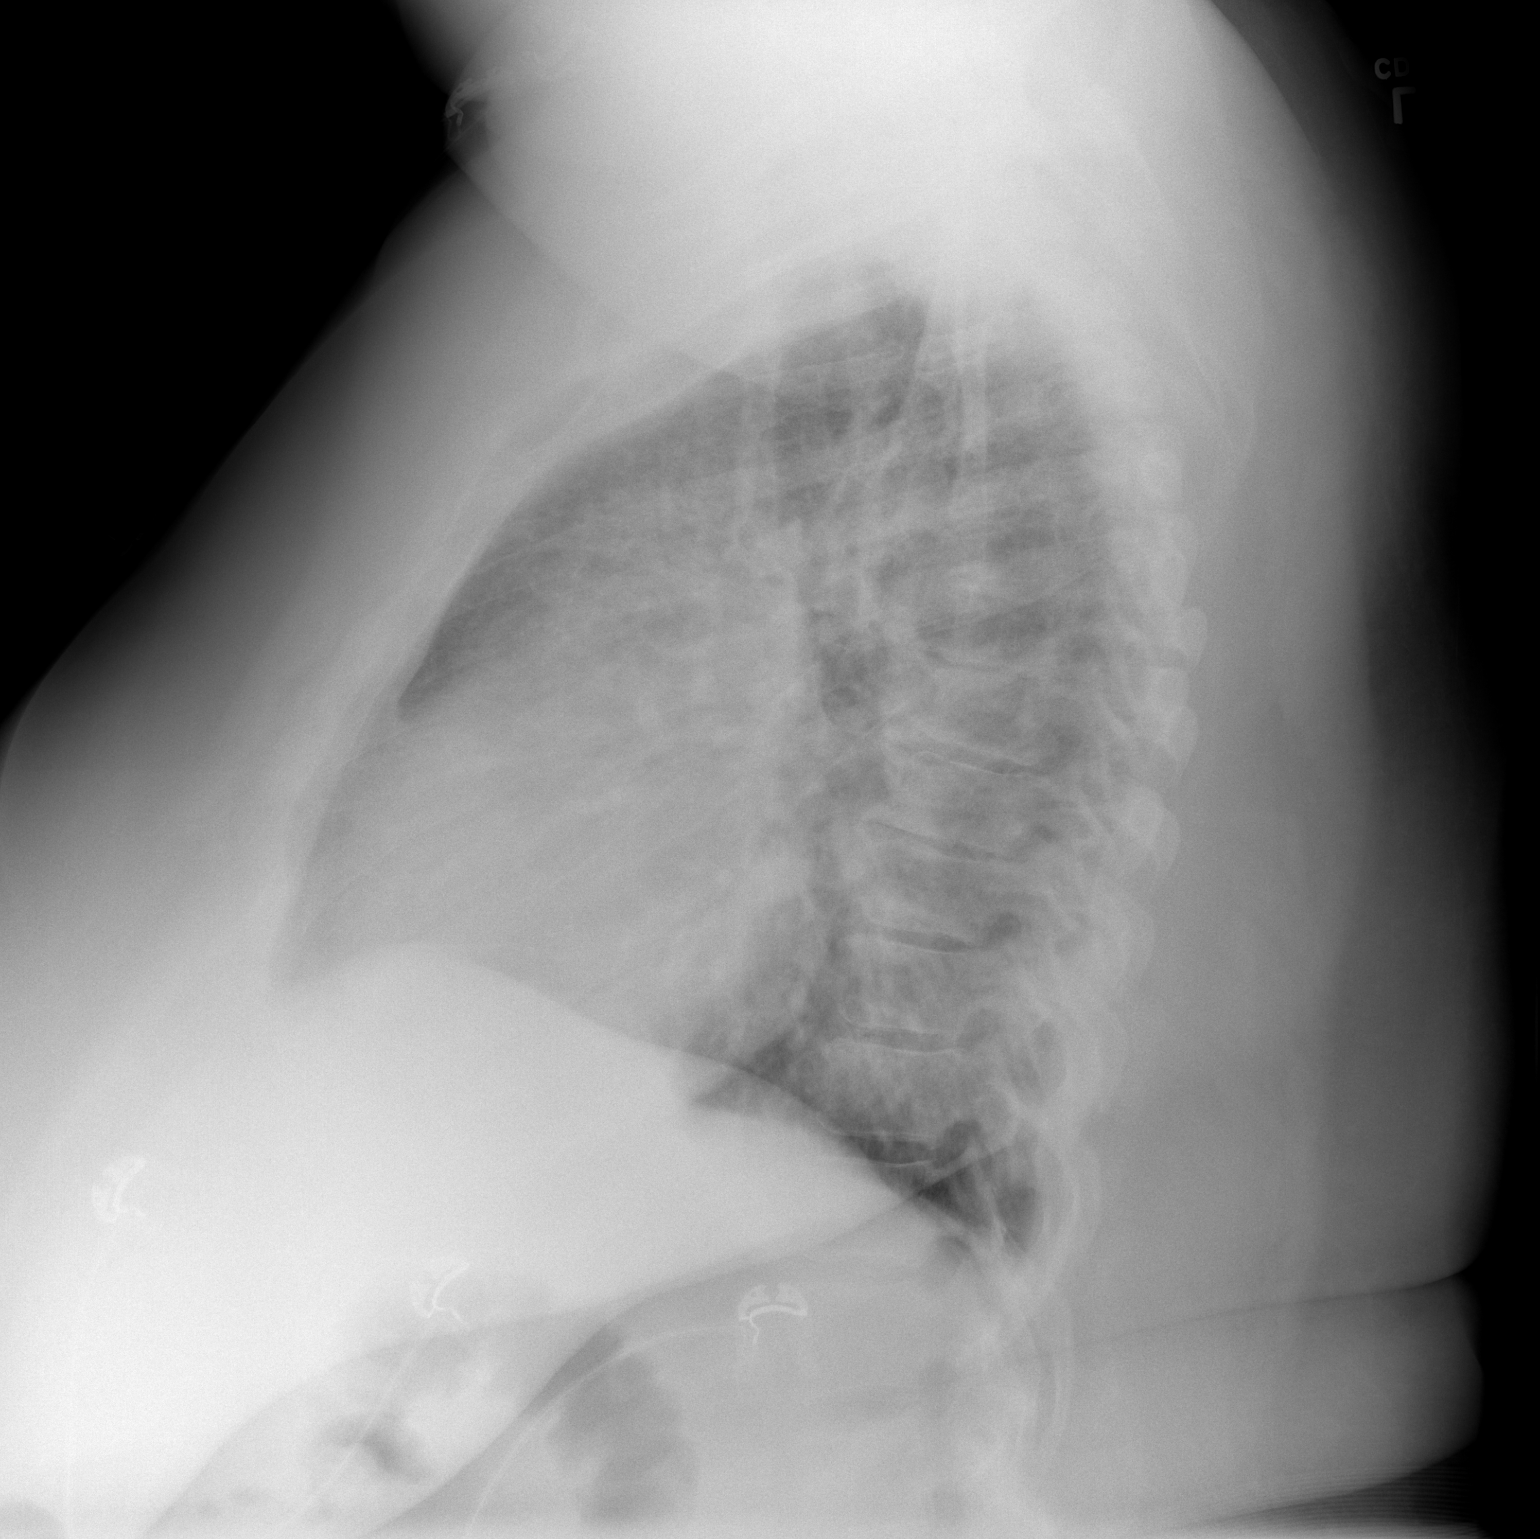

[2 of 2 positions shown; findings below may reference images not displayed]

FINDINGS: Stable cardiomegaly. No pneumothorax or pleural effusion is noted.
Mild central pulmonary vascular congestion is noted. No
consolidative process is noted. Bony thorax is unremarkable.
IMPRESSION: Stable cardiomegaly. Mild central pulmonary vascular congestion is
noted.

## 2021-05-08 ENCOUNTER — Other Ambulatory Visit: Payer: Self-pay

## 2021-05-08 ENCOUNTER — Emergency Department (HOSPITAL_BASED_OUTPATIENT_CLINIC_OR_DEPARTMENT_OTHER)
Admission: EM | Admit: 2021-05-08 | Discharge: 2021-05-08 | Disposition: A | Discharge status: Home / Self Care | Payer: Medicare Other | Attending: Emergency Medicine | Admitting: Emergency Medicine

## 2021-05-08 ENCOUNTER — Encounter (HOSPITAL_BASED_OUTPATIENT_CLINIC_OR_DEPARTMENT_OTHER): Payer: Self-pay | Admitting: Emergency Medicine

## 2021-05-08 DIAGNOSIS — L259 Unspecified contact dermatitis, unspecified cause: Secondary | ICD-10-CM | POA: Diagnosis not present

## 2021-05-08 DIAGNOSIS — Z79899 Other long term (current) drug therapy: Secondary | ICD-10-CM | POA: Diagnosis not present

## 2021-05-08 DIAGNOSIS — E119 Type 2 diabetes mellitus without complications: Secondary | ICD-10-CM | POA: Diagnosis not present

## 2021-05-08 DIAGNOSIS — Z794 Long term (current) use of insulin: Secondary | ICD-10-CM | POA: Insufficient documentation

## 2021-05-08 DIAGNOSIS — R21 Rash and other nonspecific skin eruption: Secondary | ICD-10-CM | POA: Diagnosis present

## 2021-05-08 DIAGNOSIS — I509 Heart failure, unspecified: Secondary | ICD-10-CM | POA: Insufficient documentation

## 2021-05-08 DIAGNOSIS — L309 Dermatitis, unspecified: Secondary | ICD-10-CM

## 2021-05-08 DIAGNOSIS — I110 Hypertensive heart disease with heart failure: Secondary | ICD-10-CM | POA: Insufficient documentation

## 2021-05-08 DIAGNOSIS — Z7982 Long term (current) use of aspirin: Secondary | ICD-10-CM | POA: Insufficient documentation

## 2021-05-08 MED ORDER — MUPIROCIN CALCIUM 2 % EX CREA: 1.0000 "application " | TOPICAL_CREAM | Freq: Two times a day (BID) | CUTANEOUS | 0 refills | Status: AC

## 2021-05-08 NOTE — Discharge Instructions (Addendum)
Please refer to the attached instructions and apply the mupirocin the affected areas as directed. Follow-up with your primary care provider if no improvement. ?

## 2021-05-08 NOTE — ED Triage Notes (Signed)
Reports she went to see her sister on Friday.  She is positive for MRSA.  Reports now she has sores around and in her nose and on her upper lip. ?

## 2021-05-08 NOTE — ED Provider Notes (Signed)
?[facility] EMERGENCY DEPARTMENT ?Provider Note ? ? ?CSN: [id number] ?Arrival date & time: 05/08/21  1419 ? ?  ? ?History ? ?Chief Complaint  ?Patient presents with  ? Rash  ? ? ?Amber Melendez is a 61 y.o. female. ? ?61 year old female with history of hypertension, diabetes, and CHF reports developing a scaly, draining rash on her fance just below her nares following noticing a small lesion just inside her right nare. She treated with neosporin. Rash and mild edema have progressed to the soft tissues of her chin and under her eyes. Drainage is clearin color. Incidentalcontact with a relative who is positive for MRSA. ? ? ?Rash ?Location:  Face ?Facial rash location:  Nose, upper lip, chin, L cheek and R cheek ?Quality: draining, redness and scaling   ?Severity:  Mild ?Duration:  2 days ?Timing:  Constant ?Progression:  Worsening ?Chronicity:  New ?Context: sick contacts   ?Ineffective treatments:  Antibiotic cream ?Associated symptoms: periorbital edema   ?Associated symptoms: no sore throat   ? ?  ? ?Home Medications ?Prior to Admission medications   ?Medication Sig Start Date End Date Taking? Authorizing Provider  ?aspirin 81 MG chewable tablet Chew 1 tablet (81 mg total) by mouth daily. 05/31/16   Noralee Stain, DO  ?carvedilol (COREG) 25 MG tablet Take 1 tablet (25 mg total) by mouth 2 (two) times daily with a meal. 05/30/16   Noralee Stain, DO  ?FLUoxetine (PROZAC) 40 MG capsule Take 40 mg by mouth daily.    [provider]  ?furosemide (LASIX) 80 MG tablet Take 1 tablet (80 mg total) by mouth 2 (two) times daily. 05/30/16   Noralee Stain, DO  ?gabapentin (NEURONTIN) 300 MG capsule Take 600-900 mg by mouth 2 (two) times daily. 600 MG in Am and 900 MG in evening    [provider]  ?hydrALAZINE (APRESOLINE) 25 MG tablet Take 1 tablet (25 mg total) by mouth 3 (three) times daily. 05/30/16   Noralee Stain, DO  ?insulin lispro protamine-lispro (HUMALOG 75/25 MIX) (75-25) 100 UNIT/ML SUSP  injection Inject 40-50 Units into the skin 2 (two) times daily with a meal. 50 units in the AM 40 units in the evening    [provider]  ?isosorbide mononitrate (IMDUR) 30 MG 24 hr tablet Take 1 tablet (30 mg total) by mouth daily. 05/31/16   Noralee Stain, DO  ?nicotine polacrilex (NICORETTE) 2 MG gum Take 1 each (2 mg total) by mouth as needed for smoking cessation. 05/30/16   Noralee Stain, DO  ?omeprazole (PRILOSEC) 40 MG capsule Take 1 capsule (40 mg total) by mouth 2 (two) times daily before a meal. 05/24/16   Randel Pigg, Dorma Russell, MD  ?oxycodone (ROXICODONE) 30 MG immediate release tablet Take 30 mg by mouth every 6 (six) hours as needed for pain.    [provider]  ?spironolactone (ALDACTONE) 25 MG tablet Take 1 tablet (25 mg total) by mouth daily. 05/31/16   Noralee Stain, DO  ?sucralfate (CARAFATE) 1 g tablet Take 1 tablet (1 g total) by mouth 4 (four) times daily -  with meals and at bedtime. 06/01/16 06/11/16  Virgina Norfolk, DO  ?varenicline (CHANTIX) 0.5 MG tablet Take 0.5 mg by mouth 2 (two) times daily.    [provider]  ?vitamin E 100 UNIT capsule Take 100 Units by mouth daily.    [provider]  ?   ? ?Allergies    ?Lisinopril   ? ?Review of Systems   ?Review of  Systems  ?HENT:  Negative for sore throat.   ?Skin:  Positive for rash.  ?All other systems reviewed and are negative. ? ?Physical Exam ?Updated Vital Signs ?BP (!) 138/108 (BP Location: Right Arm)   Pulse 98   Temp 98.3 ?F (36.8 ?C) (Oral)   Resp 16   Ht 5\' 7"  (1.702 m)   Wt (!) 139.2 kg   SpO2 94%   BMI 48.05 kg/m?  ?Physical Exam ?HENT:  ?   Head: Normocephalic.  ?Eyes:  ?   Extraocular Movements: Extraocular movements intact.  ?   Conjunctiva/sclera: Conjunctivae normal.  ?Cardiovascular:  ?   Rate and Rhythm: Normal rate.  ?Pulmonary:  ?   Effort: Pulmonary effort is normal.  ?Musculoskeletal:     ?   General: Normal range of motion.  ?   Cervical back: Normal range of motion and neck supple.   ?Skin: ?   Findings: Rash present.  ?Neurological:  ?   Mental Status: She is alert and oriented to person, place, and time.  ?Psychiatric:     ?   Mood and Affect: Mood normal.     ?   Behavior: Behavior normal.  ? ? ? ?ED Results / Procedures / Treatments   ?Labs ?(all labs ordered are listed, but only abnormal results are displayed) ?Labs Reviewed - No data to display ? ?EKG ?None ? ?Radiology ?No results found. ? ?Procedures ?Procedures  ? ? ?Medications Ordered in ED ?Medications - No data to display ? ?ED Course/ Medical Decision Making/ A&P ?  ?                        ?Medical Decision Making ?Risk ?Prescription drug management. ? ? ?Patient with nonspecific eruption consistent with dermatitis. History and exam findings not consistent with dangerous etiologies such as SJS/TEN, or secondary causes such as thrombocytopenia or rickettsia. Rash is not urticarial and there are no signs of anaphylaxis.  Discharge with mupirocin. Follow up with PCP in 2-3 days.  ? ? ? ? ? ? ? ?  ? ? ? ? ? ? ? ?Final Clinical Impression(s) / ED Diagnoses ?Final diagnoses:  ?Dermatitis  ? ? ?Rx / DC Orders ?ED Discharge Orders   ? ?      Ordered  ?  mupirocin cream (BACTROBAN) 2 %  2 times daily       ? 05/08/21 1600  ? ?  ?  ? ?  ? ? ?  ?05/10/21, NP ?05/08/21 1645 ? ?  ?05/10/21, DO ?05/08/21 1727 ? ?

## 2022-05-09 ENCOUNTER — Encounter (HOSPITAL_BASED_OUTPATIENT_CLINIC_OR_DEPARTMENT_OTHER): Payer: Self-pay | Admitting: Emergency Medicine

## 2022-05-09 ENCOUNTER — Emergency Department (HOSPITAL_BASED_OUTPATIENT_CLINIC_OR_DEPARTMENT_OTHER): Payer: 59

## 2022-05-09 ENCOUNTER — Emergency Department (HOSPITAL_BASED_OUTPATIENT_CLINIC_OR_DEPARTMENT_OTHER)
Admission: EM | Admit: 2022-05-09 | Discharge: 2022-05-09 | Disposition: A | Discharge status: Home / Self Care | Payer: 59 | Attending: Emergency Medicine | Admitting: Emergency Medicine

## 2022-05-09 ENCOUNTER — Other Ambulatory Visit: Payer: Self-pay

## 2022-05-09 DIAGNOSIS — I509 Heart failure, unspecified: Secondary | ICD-10-CM | POA: Diagnosis not present

## 2022-05-09 DIAGNOSIS — Z7982 Long term (current) use of aspirin: Secondary | ICD-10-CM | POA: Diagnosis not present

## 2022-05-09 DIAGNOSIS — R0602 Shortness of breath: Secondary | ICD-10-CM | POA: Diagnosis not present

## 2022-05-09 DIAGNOSIS — Z20822 Contact with and (suspected) exposure to covid-19: Secondary | ICD-10-CM | POA: Insufficient documentation

## 2022-05-09 DIAGNOSIS — M1712 Unilateral primary osteoarthritis, left knee: Secondary | ICD-10-CM | POA: Insufficient documentation

## 2022-05-09 DIAGNOSIS — Z794 Long term (current) use of insulin: Secondary | ICD-10-CM | POA: Diagnosis not present

## 2022-05-09 DIAGNOSIS — R079 Chest pain, unspecified: Secondary | ICD-10-CM | POA: Diagnosis not present

## 2022-05-09 DIAGNOSIS — D72829 Elevated white blood cell count, unspecified: Secondary | ICD-10-CM | POA: Diagnosis not present

## 2022-05-09 DIAGNOSIS — M25562 Pain in left knee: Secondary | ICD-10-CM | POA: Diagnosis present

## 2022-05-09 LAB — CBC
HCT: 40.5 % (ref 36.0–46.0)
Hemoglobin: 12.9 g/dL (ref 12.0–15.0)
MCH: 27.8 pg (ref 26.0–34.0)
MCHC: 31.9 g/dL (ref 30.0–36.0)
MCV: 87.3 fL (ref 80.0–100.0)
Platelets: 339 10*3/uL (ref 150–400)
RBC: 4.64 MIL/uL (ref 3.87–5.11)
RDW: 14.5 % (ref 11.5–15.5)
WBC: 12 10*3/uL — ABNORMAL HIGH (ref 4.0–10.5)
nRBC: 0 % (ref 0.0–0.2)

## 2022-05-09 LAB — RESP PANEL BY RT-PCR (RSV, FLU A&B, COVID)  RVPGX2
Influenza A by PCR: NEGATIVE
Influenza B by PCR: NEGATIVE
Resp Syncytial Virus by PCR: NEGATIVE
SARS Coronavirus 2 by RT PCR: NEGATIVE

## 2022-05-09 LAB — BASIC METABOLIC PANEL
Anion gap: 10 (ref 5–15)
BUN: 14 mg/dL (ref 8–23)
CO2: 22 mmol/L (ref 22–32)
Calcium: 9.2 mg/dL (ref 8.9–10.3)
Chloride: 105 mmol/L (ref 98–111)
Creatinine, Ser: 1.22 mg/dL — ABNORMAL HIGH (ref 0.44–1.00)
GFR, Estimated: 50 mL/min — ABNORMAL LOW (ref >60)
Glucose, Bld: 97 mg/dL (ref 70–99)
Potassium: 4.4 mmol/L (ref 3.5–5.1)
Sodium: 137 mmol/L (ref 135–145)

## 2022-05-09 LAB — TROPONIN I (HIGH SENSITIVITY)
Troponin I (High Sensitivity): 18 ng/L — ABNORMAL HIGH (ref <18)
Troponin I (High Sensitivity): 17 ng/L (ref <18)

## 2022-05-09 LAB — BRAIN NATRIURETIC PEPTIDE: B Natriuretic Peptide: 22.2 pg/mL (ref 0.0–100.0)

## 2022-05-09 MED ORDER — HYDROCODONE-ACETAMINOPHEN 5-325 MG PO TABS
1.0000 | ORAL_TABLET | Freq: Once | ORAL | Status: AC
  Administered 2022-05-09: 1 via ORAL
  Filled 2022-05-09: qty 1

## 2022-05-09 MED ORDER — DICLOFENAC SODIUM 1 % EX GEL: 2.0000 g | Freq: Four times a day (QID) | CUTANEOUS | 1 refills | Status: DC

## 2022-05-09 MED ORDER — OXYCODONE-ACETAMINOPHEN 5-325 MG PO TABS: 1.0000 | ORAL_TABLET | Freq: Four times a day (QID) | ORAL | 0 refills | Status: AC | PRN

## 2022-05-09 MED ORDER — KETOROLAC TROMETHAMINE 30 MG/ML IJ SOLN
15.0000 mg | Freq: Once | INTRAMUSCULAR | Status: AC
  Administered 2022-05-09: 15 mg via INTRAMUSCULAR
  Filled 2022-05-09: qty 1

## 2022-05-09 MED ORDER — DICLOFENAC SODIUM 1 % EX GEL: 2.0000 g | Freq: Four times a day (QID) | CUTANEOUS | 1 refills | Status: AC

## 2022-05-09 MED ORDER — OXYCODONE-ACETAMINOPHEN 5-325 MG PO TABS: 1.0000 | ORAL_TABLET | Freq: Four times a day (QID) | ORAL | 0 refills | Status: DC | PRN

## 2022-05-09 NOTE — Discharge Instructions (Addendum)
You were seen in the emergency department for left knee pain and increased shortness of breath.  Your left knee x-rays do appear to show some signs of osteoarthritis but your ultrasound is negative for any signs of any clots. Heart enzymes were normal at this time No acute concern for any concerning cardiac cause or pulmonary cause at this time.  Chest x-ray did show some signs of pulmonary congestion which should be contributing to the shortness of breath or experiencing.

## 2022-05-09 NOTE — ED Provider Notes (Signed)
[place] [facility]GENC[facility] DEPARTMENT AT [facility] Provider Note   CSN: [id number] Arrival date & time: 05/09/22  1236     History Chief Complaint  Patient presents with   Shortness of Breath    Amber Melendez is a 62 y.o. female.  Patient presents emergency department complaints of shortness of breath.  She initially presented for left knee pain but reports that she began experiencing some chest tightness and shortness of breath shortly after.  Patient has a prior history significant for CHF and has recently taken off of her diuretic.  She denies any obvious leg swelling but there is some swelling noted to the left knee and it is painful to move.  Denies any recent injuries to this left knee or any falls, trauma.  Has tried taking a single dose of oxycodone last night without significant improvement in pain.  No prior history of any kidney disease.  Reports that chest pain shortness of breath has improved somewhat since being here in the emergency department.   Shortness of Breath      Home Medications Prior to Admission medications   Medication Sig Start Date End Date Taking? Authorizing Provider  aspirin 81 MG chewable tablet Chew 1 tablet (81 mg total) by mouth daily. 05/31/16   Noralee Stain, DO  carvedilol (COREG) 25 MG tablet Take 1 tablet (25 mg total) by mouth 2 (two) times daily with a meal. 05/30/16   Noralee Stain, DO  diclofenac Sodium (VOLTAREN) 1 % GEL Apply 2 g topically 4 (four) times daily for 25 days. 05/09/22 06/03/22  Smitty Knudsen, PA-C  FLUoxetine (PROZAC) 40 MG capsule Take 40 mg by mouth daily.    [provider]  furosemide (LASIX) 80 MG tablet Take 1 tablet (80 mg total) by mouth 2 (two) times daily. 05/30/16   Noralee Stain, DO  gabapentin (NEURONTIN) 300 MG capsule Take 600-900 mg by mouth 2 (two) times daily. 600 MG in Am and 900 MG in evening    [provider]  hydrALAZINE (APRESOLINE) 25 MG tablet Take 1 tablet (25 mg total) by  mouth 3 (three) times daily. 05/30/16   Noralee Stain, DO  insulin lispro protamine-lispro (HUMALOG 75/25 MIX) (75-25) 100 UNIT/ML SUSP injection Inject 40-50 Units into the skin 2 (two) times daily with a meal. 50 units in the AM 40 units in the evening    [provider]  isosorbide mononitrate (IMDUR) 30 MG 24 hr tablet Take 1 tablet (30 mg total) by mouth daily. 05/31/16   Noralee Stain, DO  mupirocin cream (BACTROBAN) 2 % Apply 1 application. topically 2 (two) times daily. 05/08/21   Felicie Morn, NP  nicotine polacrilex (NICORETTE) 2 MG gum Take 1 each (2 mg total) by mouth as needed for smoking cessation. 05/30/16   Noralee Stain, DO  omeprazole (PRILOSEC) 40 MG capsule Take 1 capsule (40 mg total) by mouth 2 (two) times daily before a meal. 05/24/16   Randel Pigg, Dorma Russell, MD  oxycodone (ROXICODONE) 30 MG immediate release tablet Take 30 mg by mouth every 6 (six) hours as needed for pain.    [provider]  oxyCODONE-acetaminophen (PERCOCET/ROXICET) 5-325 MG tablet Take 1 tablet by mouth every 6 (six) hours as needed for severe pain. 05/09/22   Smitty Knudsen, PA-C  spironolactone (ALDACTONE) 25 MG tablet Take 1 tablet (25 mg total) by mouth daily. 05/31/16   Noralee Stain, DO  sucralfate (CARAFATE) 1 g tablet Take 1 tablet (1 g total) by mouth  4 (four) times daily -  with meals and at bedtime. 06/01/16 06/11/16  Curatolo, Adam, DO  varenicline (CHANTIX) 0.5 MG tablet Take 0.5 mg by mouth 2 (two) times daily.    [provider]  vitamin E 100 UNIT capsule Take 100 Units by mouth daily.    [provider]      Allergies    Lisinopril    Review of Systems   Review of Systems  Respiratory:  Positive for shortness of breath.   Musculoskeletal:  Positive for joint swelling.  All other systems reviewed and are negative.   Physical Exam Updated Vital Signs BP (!) 168/93   Pulse 81   Temp 98.6 F (37 C)   Resp 18   Ht 5\' 7"  (1.702 m)   Wt 121.6 kg   LMP  05/29/2016   SpO2 98%   BMI 41.97 kg/m  Physical Exam Vitals and nursing note reviewed.  Constitutional:      General: She is not in acute distress.    Appearance: She is well-developed.  HENT:     Head: Normocephalic and atraumatic.  Eyes:     Conjunctiva/sclera: Conjunctivae normal.  Cardiovascular:     Rate and Rhythm: Normal rate and regular rhythm.     Heart sounds: No murmur heard. Pulmonary:     Effort: Pulmonary effort is normal. No tachypnea or respiratory distress.     Breath sounds: Normal breath sounds. No decreased breath sounds.  Abdominal:     Palpations: Abdomen is soft.     Tenderness: There is no abdominal tenderness.  Musculoskeletal:        General: No swelling.     Cervical back: Neck supple.     Right lower leg: No tenderness. No edema.     Left lower leg: Tenderness present. Edema present.     Comments: Some swelling and edema noted to the left knee  Skin:    General: Skin is warm and dry.     Capillary Refill: Capillary refill takes less than 2 seconds.  Neurological:     Mental Status: She is alert.  Psychiatric:        Mood and Affect: Mood normal.     ED Results / Procedures / Treatments   Labs (all labs ordered are listed, but only abnormal results are displayed) Labs Reviewed  CBC - Abnormal; Notable for the following components:      Result Value   WBC 12.0 (*)    All other components within normal limits  BASIC METABOLIC PANEL - Abnormal; Notable for the following components:   Creatinine, Ser 1.22 (*)    GFR, Estimated 50 (*)    All other components within normal limits  TROPONIN I (HIGH SENSITIVITY) - Abnormal; Notable for the following components:   Troponin I (High Sensitivity) 18 (*)    All other components within normal limits  RESP PANEL BY RT-PCR (RSV, FLU A&B, COVID)  RVPGX2  BRAIN NATRIURETIC PEPTIDE  TROPONIN I (HIGH SENSITIVITY)    EKG None  Radiology US Venous Img Lower  Left (DVT Study)  Result Date:  05/09/2022 CLINICAL DATA:  Swelling EXAM: LEFT LOWER EXTREMITY VENOUS DOPPLER ULTRASOUND TECHNIQUE: Gray-scale sonography with compression, as well as color and duplex ultrasound, were performed to evaluate the deep venous system(s) from the level of the common femoral vein through the popliteal and proximal calf veins. COMPARISON:  LEFT knee XRs, 05/09/2022 FINDINGS: VENOUS Normal compressibility of the common femoral, superficial femoral, and popliteal veins, as  well as the visualized calf veins. Visualized portions of profunda femoral vein and great saphenous vein unremarkable. No filling defects to suggest DVT on grayscale or color Doppler imaging. Doppler waveforms show normal direction of venous flow, normal respiratory plasticity and response to augmentation. Limited views of the contralateral common femoral vein are unremarkable. OTHER No evidence of superficial thrombophlebitis or abnormal fluid collection. Limitations: none IMPRESSION: No evidence of femoropopliteal DVT or superficial thrombophlebitis within the LEFT lower extremity. Roanna Banning, MD [facility] Radiology Electronically Signed   By: Roanna Banning M.D.   On: 05/09/2022 15:48   DG Chest Portable 1 View  Result Date: 05/09/2022 CLINICAL DATA:  Shortness of breath EXAM: PORTABLE CHEST 1 VIEW COMPARISON:  CXR 05/22/16 FINDINGS: No pleural effusion. No pneumothorax. No focal airspace opacity. Cardiomegaly. Prominent bilateral interstitial opacities. No radiographically apparent displaced rib fractures. Visualized upper abdomen is unremarkable. Degenerative changes of the bilateral glenohumeral and AC joints. IMPRESSION: Cardiomegaly with pulmonary venous congestion. Electronically Signed   By: Lorenza Cambridge M.D.   On: 05/09/2022 14:32   DG Knee 2 Views Left  Result Date: 05/09/2022 CLINICAL DATA:  Pain for a week.  No history of trauma EXAM: LEFT KNEE - 2 VIEW COMPARISON:  None Available.  FINDINGS: Chondrocalcinosis. Osteophyte formation of all 3 compartments. Mild joint space loss. Trace joint fluid. No acute fracture or dislocation. IMPRESSION: Moderate tricompartmental degenerative changes. Chondrocalcinosis. Trace joint fluid Electronically Signed   By: Karen Kays M.D.   On: 05/09/2022 14:31    Procedures Procedures   Medications Ordered in ED Medications  HYDROcodone-acetaminophen (NORCO/VICODIN) 5-325 MG per tablet 1 tablet (1 tablet Oral Given 05/09/22 1417)  ketorolac (TORADOL) 30 MG/ML injection 15 mg (15 mg Intramuscular Given 05/09/22 1704)    ED Course/ Medical Decision Making/ A&P                           Medical Decision Making Amount and/or Complexity of Data Reviewed Labs: ordered. Radiology: ordered.  Risk Prescription drug management.   This patient presents to the ED for concern of knee pain, SOB. Differential diagnosis includes osteoarthritis, septic arthritis, pulm embolism, DVT, cellulitis   Lab Tests:  I Ordered, and personally interpreted labs.  The pertinent results include: Leukocytosis of 12.0, CBC otherwise unremarkable, BMP largely unremarkable with creatinine and GFR at baseline, BNP normal at 22.2, troponin negative at 18 with delta troponin at 17, respiratory viral panel negative.   Imaging Studies ordered:  I ordered imaging studies including chest x-ray, left knee x-ray, ultrasound of left lower leg I independently visualized and interpreted imaging which showed cardiomegaly with pulmonary venous congestion, mild degenerative changes in the left knee, no evidence of DVT I agree with the radiologist interpretation   Medicines ordered and prescription drug management:  I ordered medication including Norco, Toradol for pain Reevaluation of the patient after these medicines showed that the patient improved I have reviewed the patients home medicines and have made adjustments as needed   Problem List / ED Course:  Patient  presented to the ED with concerns for left knee pain. Once in triage, she also remarked about concerns for SOB. After discussing with patient, appears that SOB is more associated with pain in left knee feeling like she can't breath when pain flares up knee. However, workup initiated regardless which was thankfully reassuring. Troponin initially weekly positive at 18 with delta troponin declining slightly to 17. CBC largely unremarkable  and based on patient's chart review, appears that she may have a baseline elevation in WBC. Advised patient that she should follow up with a hematologist for further evaluation of potential marrow abnormalities causing persistently elevated WBC. Labs otherwise unremarkable. Given level of pain in knee, imaging and [place] ordered which was thankfully reassuring without evidence of DVT and only evidence of arthritic changes seen in left knee. Advised patient of these findings and encouraged patient to follow up with PCP for further evaluation. Provided patient with contact information for [employer]. Patient agreeable with this treatment plan and verbalized understanding all return precautions. All questions answered prior to patient discharge.  Final Clinical Impression(s) / ED Diagnoses Final diagnoses:  Arthritis of left knee  Leukocytosis, unspecified type  SOB (shortness of breath)    Rx / DC Orders ED Discharge Orders          Ordered    oxyCODONE-acetaminophen (PERCOCET/ROXICET) 5-325 MG tablet  Every 6 hours PRN,   Status:  Discontinued        05/09/22 1649    diclofenac Sodium (VOLTAREN) 1 % GEL  4 times daily,   Status:  Discontinued        05/09/22 1649    oxyCODONE-acetaminophen (PERCOCET/ROXICET) 5-325 MG tablet  Every 6 hours PRN        05/09/22 1756    diclofenac Sodium (VOLTAREN) 1 % GEL  4 times daily        05/09/22 1756              Smitty Knudsen, PA-C 05/09/22 2351    Terald Sleeper, MD 05/12/22 [id number]

## 2022-05-09 NOTE — ED Triage Notes (Addendum)
Pt reports left knee pain x 1 week, hx OA, denies any injury, reports pain radiates up and down the leg, obvious edema present, has been using aspercreme and heat therapy with little relief; added on she is now having pain in her chest and feeling ShoB

## 2023-11-23 ENCOUNTER — Other Ambulatory Visit: Payer: Self-pay

## 2023-11-23 ENCOUNTER — Encounter (HOSPITAL_BASED_OUTPATIENT_CLINIC_OR_DEPARTMENT_OTHER): Payer: Self-pay

## 2023-11-23 ENCOUNTER — Emergency Department (HOSPITAL_BASED_OUTPATIENT_CLINIC_OR_DEPARTMENT_OTHER)
Admission: EM | Admit: 2023-11-23 | Discharge: 2023-11-23 | Disposition: A | Discharge status: Home / Self Care | Attending: Emergency Medicine | Admitting: Emergency Medicine

## 2023-11-23 ENCOUNTER — Emergency Department (HOSPITAL_BASED_OUTPATIENT_CLINIC_OR_DEPARTMENT_OTHER)

## 2023-11-23 DIAGNOSIS — E119 Type 2 diabetes mellitus without complications: Secondary | ICD-10-CM | POA: Diagnosis not present

## 2023-11-23 DIAGNOSIS — I110 Hypertensive heart disease with heart failure: Secondary | ICD-10-CM | POA: Diagnosis not present

## 2023-11-23 DIAGNOSIS — Z794 Long term (current) use of insulin: Secondary | ICD-10-CM | POA: Diagnosis not present

## 2023-11-23 DIAGNOSIS — J22 Unspecified acute lower respiratory infection: Secondary | ICD-10-CM | POA: Diagnosis not present

## 2023-11-23 DIAGNOSIS — R059 Cough, unspecified: Secondary | ICD-10-CM | POA: Diagnosis present

## 2023-11-23 DIAGNOSIS — Z79899 Other long term (current) drug therapy: Secondary | ICD-10-CM | POA: Insufficient documentation

## 2023-11-23 DIAGNOSIS — I509 Heart failure, unspecified: Secondary | ICD-10-CM | POA: Diagnosis not present

## 2023-11-23 LAB — TROPONIN T, HIGH SENSITIVITY: Troponin T High Sensitivity: <15 ng/L (ref 0–19)

## 2023-11-23 LAB — CBC
HCT: 41.9 % (ref 36.0–46.0)
Hemoglobin: 13.6 g/dL (ref 12.0–15.0)
MCH: 28.2 pg (ref 26.0–34.0)
MCHC: 32.5 g/dL (ref 30.0–36.0)
MCV: 86.7 fL (ref 80.0–100.0)
Platelets: 258 K/uL (ref 150–400)
RBC: 4.83 MIL/uL (ref 3.87–5.11)
RDW: 14.6 % (ref 11.5–15.5)
WBC: 12.8 K/uL — ABNORMAL HIGH (ref 4.0–10.5)
nRBC: 0 % (ref 0.0–0.2)

## 2023-11-23 LAB — BASIC METABOLIC PANEL WITH GFR
Anion gap: 15 (ref 5–15)
BUN: 16 mg/dL (ref 8–23)
CO2: 23 mmol/L (ref 22–32)
Calcium: 9 mg/dL (ref 8.9–10.3)
Chloride: 101 mmol/L (ref 98–111)
Creatinine, Ser: 1.38 mg/dL — ABNORMAL HIGH (ref 0.44–1.00)
GFR, Estimated: 43 mL/min — ABNORMAL LOW (ref >60)
Glucose, Bld: 115 mg/dL — ABNORMAL HIGH (ref 70–99)
Potassium: 3.9 mmol/L (ref 3.5–5.1)
Sodium: 139 mmol/L (ref 135–145)

## 2023-11-23 MED ORDER — IPRATROPIUM-ALBUTEROL 0.5-2.5 (3) MG/3ML IN SOLN
3.0000 mL | Freq: Once | RESPIRATORY_TRACT | Status: AC
  Administered 2023-11-23: 3 mL via RESPIRATORY_TRACT
  Filled 2023-11-23: qty 3

## 2023-11-23 MED ORDER — PROMETHAZINE-DM 6.25-15 MG/5ML PO SYRP: 5.0000 mL | ORAL_SOLUTION | Freq: Four times a day (QID) | ORAL | 0 refills | Status: AC | PRN

## 2023-11-23 MED ORDER — DOXYCYCLINE HYCLATE 100 MG PO TABS
100.0000 mg | ORAL_TABLET | Freq: Once | ORAL | Status: AC
  Administered 2023-11-23: 100 mg via ORAL
  Filled 2023-11-23: qty 1

## 2023-11-23 MED ORDER — DOXYCYCLINE HYCLATE 100 MG PO CAPS: 100.0000 mg | ORAL_CAPSULE | Freq: Two times a day (BID) | ORAL | 0 refills | Status: AC

## 2023-11-23 MED ORDER — ALBUTEROL SULFATE HFA 108 (90 BASE) MCG/ACT IN AERS
2.0000 | INHALATION_SPRAY | RESPIRATORY_TRACT | Status: DC | PRN
  Administered 2023-11-23: 2 via RESPIRATORY_TRACT
  Filled 2023-11-23: qty 6.7

## 2023-11-23 MED ORDER — ALBUTEROL SULFATE (2.5 MG/3ML) 0.083% IN NEBU
2.5000 mg | INHALATION_SOLUTION | Freq: Once | RESPIRATORY_TRACT | Status: AC
  Administered 2023-11-23: 2.5 mg via RESPIRATORY_TRACT
  Filled 2023-11-23: qty 3

## 2023-11-23 NOTE — ED Triage Notes (Signed)
 Pt comes via [facility] for CP and SOB that has been accompanied with a cold for the past few days, productive cough with white sputum

## 2023-11-23 NOTE — ED Provider Notes (Signed)
 [place] EM[facility] DE[facility]MENT AT [facility]  Provider Note  CSN: [id number] Arrival date & time: 11/23/23 1950  History Chief Complaint  Patient presents with   Shortness of Breath    Amber Melendez is a 63 y.o. female with history of DM, CHF HTN reports 4 days of productive cough, fever initially has improved. Some tightness with deep breath and with coughing. No history of asthma or COPD.    Home Medications Prior to Admission medications   Medication Sig Start Date End Date Taking? Authorizing Provider  doxycycline (VIBRAMYCIN) 100 MG capsule Take 1 capsule (100 mg total) by mouth 2 (two) times daily. 11/23/23  Yes Roselyn Carlin NOVAK, MD  promethazine-dextromethorphan  (PROMETHAZINE-DM) 6.25-15 MG/5ML syrup Take 5 mLs by mouth 4 (four) times daily as needed for cough. 11/23/23  Yes Roselyn Carlin NOVAK, MD  aspirin  81 MG chewable tablet Chew 1 tablet (81 mg total) by mouth daily. 05/31/16   Rojelio Nest, DO  carvedilol  (COREG ) 25 MG tablet Take 1 tablet (25 mg total) by mouth 2 (two) times daily with a meal. 05/30/16   Rojelio Nest, DO  FLUoxetine  (PROZAC ) 40 MG capsule Take 40 mg by mouth daily.    [provider]  furosemide  (LASIX ) 80 MG tablet Take 1 tablet (80 mg total) by mouth 2 (two) times daily. 05/30/16   Rojelio Nest, DO  gabapentin  (NEURONTIN ) 300 MG capsule Take 600-900 mg by mouth 2 (two) times daily. 600 MG in Am and 900 MG in evening    [provider]  hydrALAZINE  (APRESOLINE ) 25 MG tablet Take 1 tablet (25 mg total) by mouth 3 (three) times daily. 05/30/16   Rojelio Nest, DO  insulin  lispro protamine-lispro (HUMALOG 75/25 MIX) (75-25) 100 UNIT/ML SUSP injection Inject 40-50 Units into the skin 2 (two) times daily with a meal. 50 units in the AM 40 units in the evening    [provider]  isosorbide  mononitrate (IMDUR ) 30 MG 24 hr tablet Take 1 tablet (30 mg total) by mouth daily. 05/31/16   Rojelio Nest, DO  mupirocin  cream  (BACTROBAN ) 2 % Apply 1 application. topically 2 (two) times daily. 05/08/21   Claudene Lenis, NP  nicotine  polacrilex (NICORETTE ) 2 MG gum Take 1 each (2 mg total) by mouth as needed for smoking cessation. 05/30/16   Rojelio Nest, DO  omeprazole  (PRILOSEC) 40 MG capsule Take 1 capsule (40 mg total) by mouth 2 (two) times daily before a meal. 05/24/16   Nikki Rams, Aliene, MD  oxycodone  (ROXICODONE ) 30 MG immediate release tablet Take 30 mg by mouth every 6 (six) hours as needed for pain.    [provider]  oxyCODONE -acetaminophen  (PERCOCET/ROXICET) 5-325 MG tablet Take 1 tablet by mouth every 6 (six) hours as needed for severe pain. 05/09/22   Zelaya, Oscar A, PA-C  spironolactone  (ALDACTONE ) 25 MG tablet Take 1 tablet (25 mg total) by mouth daily. 05/31/16   Rojelio Nest, DO  sucralfate  (CARAFATE ) 1 g tablet Take 1 tablet (1 g total) by mouth 4 (four) times daily -  with meals and at bedtime. 06/01/16 06/11/16  Curatolo, Adam, DO  varenicline  (CHANTIX ) 0.5 MG tablet Take 0.5 mg by mouth 2 (two) times daily.    [provider]  vitamin E 100 UNIT capsule Take 100 Units by mouth daily.    [provider]     Allergies    Lisinopril    Review of Systems   Review of Systems Please see HPI for pertinent positives and  negatives  Physical Exam BP 135/82   Pulse 94   Temp 98.6 F (37 C) (Oral)   Resp 18   LMP 05/29/2016   SpO2 97%   Physical Exam Vitals and nursing note reviewed.  Constitutional:      Appearance: Normal appearance.  HENT:     Head: Normocephalic and atraumatic.     Nose: Nose normal.     Mouth/Throat:     Mouth: Mucous membranes are moist.  Eyes:     Extraocular Movements: Extraocular movements intact.     Conjunctiva/sclera: Conjunctivae normal.  Cardiovascular:     Rate and Rhythm: Normal rate.  Pulmonary:     Effort: Pulmonary effort is normal.     Breath sounds: Normal breath sounds. No wheezing or rales.  Abdominal:     General:  Abdomen is flat.     Palpations: Abdomen is soft.     Tenderness: There is no abdominal tenderness.  Musculoskeletal:        General: No swelling. Normal range of motion.     Cervical back: Neck supple.  Skin:    General: Skin is warm and dry.  Neurological:     General: No focal deficit present.     Mental Status: She is alert.  Psychiatric:        Mood and Affect: Mood normal.     ED Results / Procedures / Treatments   EKG See MUSE  Procedures Procedures  Medications Ordered in the ED Medications  doxycycline (VIBRA-TABS) tablet 100 mg (has no administration in time range)  albuterol  (VENTOLIN  HFA) 108 (90 Base) MCG/ACT inhaler 2 puff (has no administration in time range)  ipratropium-albuterol  (DUONEB) 0.5-2.5 (3) MG/3ML nebulizer solution 3 mL (3 mLs Nebulization Given 11/23/23 2226)  albuterol  (PROVENTIL ) (2.5 MG/3ML) 0.083% nebulizer solution 2.5 mg (2.5 mg Nebulization Given 11/23/23 2226)    Initial Impression and Plan  Patient here with symptoms of respiratory infection. Given a neb in triage with some improvement. Resting comfortably on my evaluation but still having a rough cough. Vitals are otherwise reassuring without fever or hypoxia. Labs done in triage show a CBC with mild leukocytosis, BMP with Cr at baseline. Trop is neg. I personally viewed the images from radiology studies and agree with radiologist interpretation: CXR is clear. Given persistent symptoms and comorbidities a trial of Abx is warranted. Will also give an inhaler to use as needed, Rx for cough syrup. PCP follow up, RTED for any other concerns.     ED Course       MDM Rules/Calculators/A&P Medical Decision Making Problems Addressed: Lower respiratory infection: acute illness or injury  Amount and/or Complexity of Data Reviewed Labs: ordered. Radiology: ordered and independent interpretation performed. Decision-making details documented in ED Course. ECG/medicine tests: ordered and  independent interpretation performed. Decision-making details documented in ED Course.  Risk Prescription drug management.     Final Clinical Impression(s) / ED Diagnoses Final diagnoses:  Lower respiratory infection    Rx / DC Orders ED Discharge Orders          Ordered    doxycycline (VIBRAMYCIN) 100 MG capsule  2 times daily        11/23/23 2316    promethazine-dextromethorphan  (PROMETHAZINE-DM) 6.25-15 MG/5ML syrup  4 times daily PRN        11/23/23 2316             Roselyn Carlin NOVAK, MD 11/23/23 2317

## 2023-11-23 NOTE — ED Notes (Signed)
 RN provided AVS using Teachback Method. Patient verbalizes understanding of Discharge Instructions. Opportunity for Questioning and Answers were provided by RN. Patient Discharged from ED in wheelchair. Currently waiting on transportation home and opted to wait in waiting area. All belongings provided to patient and brought in wheelchair to lobby near registration area.
